# Patient Record
Sex: Female | Born: 1935 | Race: White | Hispanic: No | Smoking: Never smoker
Health system: Southern US, Community
[De-identification: ages and names within clinical notes are randomized; demographics above are authoritative.]

## PROBLEM LIST (undated history)

## (undated) DIAGNOSIS — E079 Disorder of thyroid, unspecified: Secondary | ICD-10-CM

## (undated) DIAGNOSIS — E871 Hypo-osmolality and hyponatremia: Secondary | ICD-10-CM

## (undated) DIAGNOSIS — E785 Hyperlipidemia, unspecified: Secondary | ICD-10-CM

## (undated) DIAGNOSIS — I499 Cardiac arrhythmia, unspecified: Secondary | ICD-10-CM

## (undated) DIAGNOSIS — I10 Essential (primary) hypertension: Secondary | ICD-10-CM

## (undated) HISTORY — PX: CARDIAC SURGERY: SHX584

## (undated) HISTORY — PX: APPENDECTOMY: SHX54

---

## 2003-12-13 ENCOUNTER — Other Ambulatory Visit: Payer: Self-pay

## 2004-12-17 ENCOUNTER — Ambulatory Visit: Payer: Self-pay | Admitting: Family Medicine

## 2005-01-03 ENCOUNTER — Ambulatory Visit: Payer: Self-pay | Admitting: Rheumatology

## 2005-12-23 ENCOUNTER — Ambulatory Visit: Payer: Self-pay | Admitting: Family Medicine

## 2007-01-01 ENCOUNTER — Ambulatory Visit: Payer: Self-pay | Admitting: Family Medicine

## 2008-01-04 ENCOUNTER — Ambulatory Visit: Payer: Self-pay | Admitting: Family Medicine

## 2008-03-02 ENCOUNTER — Ambulatory Visit: Payer: Self-pay | Admitting: Unknown Physician Specialty

## 2008-03-08 ENCOUNTER — Ambulatory Visit: Payer: Self-pay | Admitting: Unknown Physician Specialty

## 2009-01-05 ENCOUNTER — Ambulatory Visit: Payer: Self-pay | Admitting: Family Medicine

## 2009-11-09 ENCOUNTER — Ambulatory Visit: Payer: Self-pay | Admitting: Family Medicine

## 2009-11-28 ENCOUNTER — Ambulatory Visit: Payer: Self-pay | Admitting: Family Medicine

## 2010-01-09 ENCOUNTER — Ambulatory Visit: Payer: Self-pay | Admitting: Family Medicine

## 2010-09-17 ENCOUNTER — Emergency Department: Payer: Self-pay | Admitting: Emergency Medicine

## 2011-08-01 ENCOUNTER — Ambulatory Visit: Payer: Self-pay | Admitting: Urology

## 2011-08-08 ENCOUNTER — Ambulatory Visit: Payer: Self-pay | Admitting: Urology

## 2011-08-08 LAB — CREATININE, SERUM
Creatinine: 0.81 mg/dL (ref 0.60–1.30)
EGFR (Non-African Amer.): >60

## 2011-10-14 ENCOUNTER — Emergency Department: Payer: Self-pay | Admitting: *Deleted

## 2011-10-14 LAB — BASIC METABOLIC PANEL
Anion Gap: 9 (ref 7–16)
BUN: 16 mg/dL (ref 7–18)
Calcium, Total: 8.5 mg/dL (ref 8.5–10.1)
Creatinine: 0.77 mg/dL (ref 0.60–1.30)
EGFR (African American): >60
EGFR (Non-African Amer.): >60
Potassium: 3.8 mmol/L (ref 3.5–5.1)

## 2011-10-14 LAB — CBC
HCT: 34 % — ABNORMAL LOW (ref 35.0–47.0)
MCH: 28.8 pg (ref 26.0–34.0)
MCV: 87 fL (ref 80–100)
Platelet: 316 10*3/uL (ref 150–440)
RBC: 3.93 10*6/uL (ref 3.80–5.20)
WBC: 5 10*3/uL (ref 3.6–11.0)

## 2011-10-14 LAB — URINALYSIS, COMPLETE
Bacteria: NONE SEEN
Blood: NEGATIVE
Glucose,UR: NEGATIVE mg/dL (ref 0–75)
Nitrite: NEGATIVE
Ph: 6 (ref 4.5–8.0)
Protein: NEGATIVE
Squamous Epithelial: NONE SEEN
WBC UR: <1 /HPF (ref 0–5)

## 2012-07-15 ENCOUNTER — Ambulatory Visit: Payer: Self-pay | Admitting: Neurology

## 2012-09-30 ENCOUNTER — Ambulatory Visit: Payer: Self-pay | Admitting: Family Medicine

## 2013-08-11 ENCOUNTER — Ambulatory Visit: Payer: Self-pay | Admitting: Ophthalmology

## 2014-02-03 ENCOUNTER — Ambulatory Visit: Payer: Self-pay | Admitting: Family Medicine

## 2014-02-03 ENCOUNTER — Observation Stay: Payer: Self-pay | Admitting: Surgery

## 2014-02-03 LAB — COMPREHENSIVE METABOLIC PANEL
Albumin: 2.9 g/dL — ABNORMAL LOW (ref 3.4–5.0)
Alkaline Phosphatase: 72 U/L
Anion Gap: 12 (ref 7–16)
BILIRUBIN TOTAL: 0.6 mg/dL (ref 0.2–1.0)
BUN: 14 mg/dL (ref 7–18)
CALCIUM: 8.3 mg/dL — AB (ref 8.5–10.1)
CHLORIDE: 94 mmol/L — AB (ref 98–107)
CO2: 22 mmol/L (ref 21–32)
Creatinine: 0.73 mg/dL (ref 0.60–1.30)
EGFR (African American): >60
EGFR (Non-African Amer.): >60
GLUCOSE: 104 mg/dL — AB (ref 65–99)
OSMOLALITY: 258 (ref 275–301)
Potassium: 3.6 mmol/L (ref 3.5–5.1)
SGOT(AST): 13 U/L — ABNORMAL LOW (ref 15–37)
SGPT (ALT): 13 U/L — ABNORMAL LOW
Sodium: 128 mmol/L — ABNORMAL LOW (ref 136–145)
Total Protein: 6.7 g/dL (ref 6.4–8.2)

## 2014-02-03 LAB — URINALYSIS, COMPLETE
BILIRUBIN, UR: NEGATIVE
Glucose,UR: NEGATIVE mg/dL (ref 0–75)
Leukocyte Esterase: NEGATIVE
NITRITE: NEGATIVE
PH: 6 (ref 4.5–8.0)
PROTEIN: NEGATIVE
SQUAMOUS EPITHELIAL: NONE SEEN
Specific Gravity: 1.038 (ref 1.003–1.030)
WBC UR: 3 /HPF (ref 0–5)

## 2014-02-03 LAB — CBC
HCT: 36.1 % (ref 35.0–47.0)
HGB: 12.2 g/dL (ref 12.0–16.0)
MCH: 30.4 pg (ref 26.0–34.0)
MCHC: 33.9 g/dL (ref 32.0–36.0)
MCV: 90 fL (ref 80–100)
PLATELETS: 236 10*3/uL (ref 150–440)
RBC: 4.02 10*6/uL (ref 3.80–5.20)
RDW: 13.2 % (ref 11.5–14.5)
WBC: 14.8 10*3/uL — ABNORMAL HIGH (ref 3.6–11.0)

## 2014-02-03 LAB — PROTIME-INR
INR: 1.1
Prothrombin Time: 13.8 secs (ref 11.5–14.7)

## 2014-02-03 LAB — LIPASE, BLOOD: Lipase: 98 U/L (ref 73–393)

## 2014-02-05 LAB — BASIC METABOLIC PANEL
Anion Gap: 6 — ABNORMAL LOW (ref 7–16)
BUN: 9 mg/dL (ref 7–18)
CALCIUM: 7.6 mg/dL — AB (ref 8.5–10.1)
CHLORIDE: 102 mmol/L (ref 98–107)
Co2: 24 mmol/L (ref 21–32)
Creatinine: 0.73 mg/dL (ref 0.60–1.30)
EGFR (African American): >60
EGFR (Non-African Amer.): >60
GLUCOSE: 73 mg/dL (ref 65–99)
Osmolality: 262 (ref 275–301)
Potassium: 2.9 mmol/L — ABNORMAL LOW (ref 3.5–5.1)
SODIUM: 132 mmol/L — AB (ref 136–145)

## 2014-02-07 LAB — PATHOLOGY REPORT

## 2014-02-10 ENCOUNTER — Ambulatory Visit: Payer: Self-pay | Admitting: Family Medicine

## 2014-02-12 ENCOUNTER — Emergency Department: Payer: Self-pay | Admitting: Emergency Medicine

## 2014-02-12 LAB — BASIC METABOLIC PANEL
ANION GAP: 7 (ref 7–16)
BUN: 11 mg/dL (ref 7–18)
CALCIUM: 8.5 mg/dL (ref 8.5–10.1)
CREATININE: 0.72 mg/dL (ref 0.60–1.30)
Chloride: 90 mmol/L — ABNORMAL LOW (ref 98–107)
Co2: 29 mmol/L (ref 21–32)
EGFR (African American): >60
GLUCOSE: 117 mg/dL — AB (ref 65–99)
OSMOLALITY: 254 (ref 275–301)
Potassium: 3.9 mmol/L (ref 3.5–5.1)
SODIUM: 126 mmol/L — AB (ref 136–145)

## 2014-02-12 LAB — CBC
HCT: 40.5 % (ref 35.0–47.0)
HGB: 13.1 g/dL (ref 12.0–16.0)
MCH: 29.3 pg (ref 26.0–34.0)
MCHC: 32.3 g/dL (ref 32.0–36.0)
MCV: 91 fL (ref 80–100)
PLATELETS: 526 10*3/uL — AB (ref 150–440)
RBC: 4.46 10*6/uL (ref 3.80–5.20)
RDW: 13.3 % (ref 11.5–14.5)
WBC: 9.8 10*3/uL (ref 3.6–11.0)

## 2014-02-12 LAB — TROPONIN I: Troponin-I: <0.02 ng/mL

## 2014-03-02 IMAGING — CR DG CHEST 2V
1 series · 2 of 2 positions shown · non-contrast
Comparison: none

REASON FOR EXAM: syncopal
COMMENTS:

[Series 1: pa · 0.17mm/px · 2 of 2 slices shown]
[im 1/2]
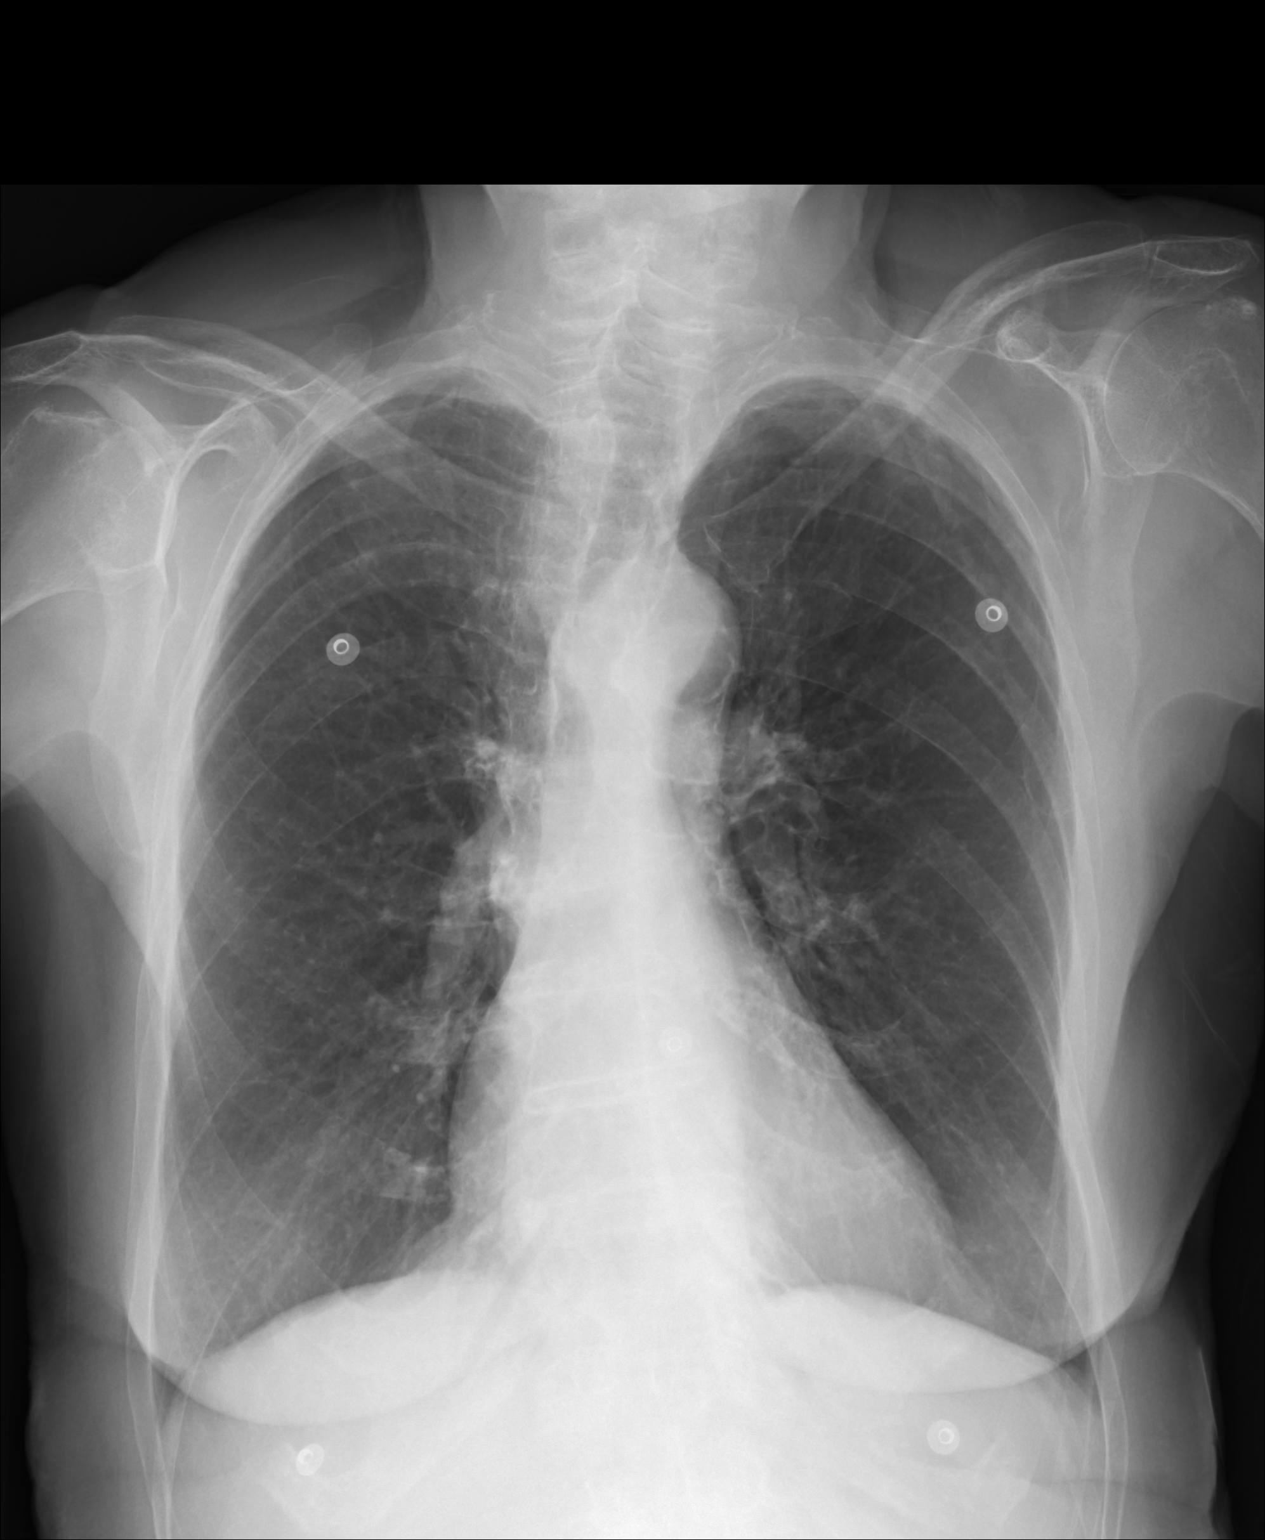
[im 2/2]
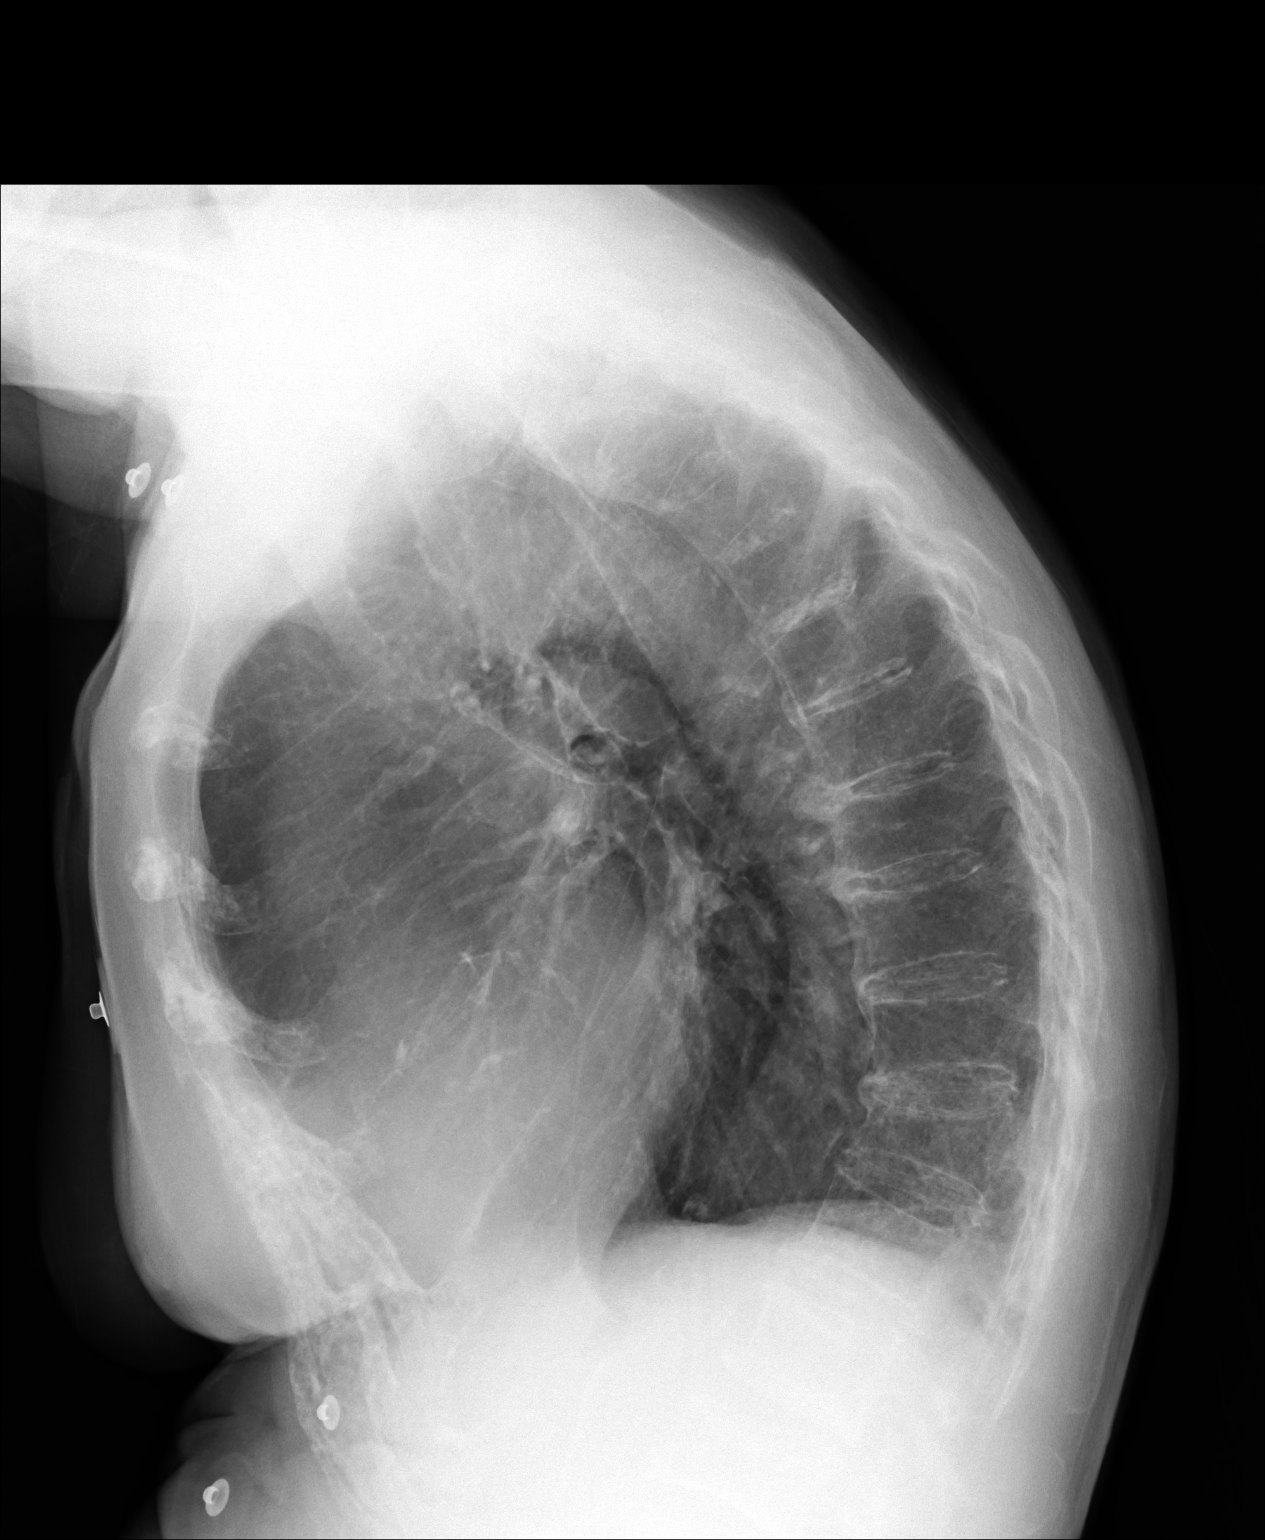

[2 of 2 positions shown; findings below may reference images not displayed]

PROCEDURE:     DXR - DXR CHEST PA (OR AP) AND LATERAL  - October 14, 2011  [DATE]

RESULT:     Comparison is made to the prior exam of 09/17/2010.

The lung fields are clear. No pneumonia, pneumothorax or pleural effusion is
seen. There is a mild, old central vertebral compression deformity of T12 of
approximately 20% to 30%. The heart size is normal.
IMPRESSION: 1.  No acute changes are identified.
2.  The lung fields are clear.
3.  There is a mild, old appearing biconcave vertebral compression deformity
of T12.

[REDACTED]

## 2014-05-20 HISTORY — PX: PACEMAKER INSERTION: SHX728

## 2014-09-10 NOTE — Op Note (Signed)
PATIENT NAME:  Laura Delacruz, Laura Delacruz MR#:  [id number] DATE OF BIRTH:  [birth year]  DATE OF PROCEDURE:  02/04/2014  PREOPERATIVE DIAGNOSIS:  Acute appendicitis.   POSTOPERATIVE DIAGNOSIS: Acute appendicitis, gangrenous and perforated.   SURGEON:  Consuela Mimes, MD   ANESTHESIA:  General.   OPERATION PERFORMED:  Laparoscopic appendectomy.   PROCEDURE IN DETAIL:  The patient was placed supine on the operating room table and prepped and draped in the usual sterile fashion. A 15 mmHg CO2 pneumoperitoneum was created via a Hassan cannula that was introduced in the supraumbilical midline amidst horizontal mattress sutures of 0 Vicryl. The other two 5 mm trocars were placed under direct visualization. The patient's omentum was densely adherent over the appendix, and after this was peeled off, it was apparent that the appendix was gangrenous near the base with foul smelling pus that emanated. The mesoappendix was taken down with harmonic scalpel and the appendectomy was performed with an Endo GIA stapling device just where the appendix met the cecum such that the staple line was on viable tissue. There was a small area of cecal wall that was discolored from the gangrenous appendix, but this was thought to be sympathetic and not actual cecal wall necrosis. The right lower quadrant was irrigated with copious amounts of warm normal saline. This was suctioned clear from the right lower quadrant, the right gutter, the Morison pouch and the pelvis. The omentum was dragged back over top of the appendiceal stump area and the peritoneum was desufflated and decannulated. The previously placed Vicryls were tied one to another and an additional 0 PDS suture was placed amidst these and then all three skin sites were closed with subcuticular 5-0 Monocryl and suture strips. The patient tolerated the procedure well. There were no complications.    ____________________________ Consuela Mimes, MD wfm:aw D: 02/04/2014  02:26:00 ET T: 02/04/2014 04:02:47 ET JOB#: [id number] Consuela Mimes MD ELECTRONICALLY SIGNED 02/11/2014 20:19

## 2014-09-10 NOTE — H&P (Signed)
History of Present Illness 81 yof wh began having mild abdominal pain 2 1/2 days ago (Tuesday AM). SHe has been able to sleep both nights, but today her pain worsened, particularly with movement. No fever, chills, anorexia (last ate at 1130), vomiting. She has had some nausea.   Past History HTN Hypothyroidism Dyslipidemia   Past Med/Surgical Hx:  Multi-drug Resistant Organism (MDRO): Positive culture for MRSA.  left arm fx:   Left hip fx:   ALLERGIES:  No Known Allergies:   HOME MEDICATIONS: Medication Instructions Status  Aspirin Enteric Coated 81 mg oral delayed release tablet 1 tab(s) orally once a day Active  calcium carbonate 500 mg oral tablet, chewable 1 tab(s) orally 2 times a day Active  Vitamin D3 1000 intl units oral tablet 1 tab(s) orally once a day Active  Vitamin B-12 1000 mcg oral tablet 1 tab(s) orally once a day Active  Klor-Con 20 mEq oral tablet, extended release 1 tab(s) orally 2 times a day Active  levothyroxine 112 mcg (0.112 mg) oral tablet 1 tab(s) orally once a day Active  lisinopril 10 mg oral tablet 1 tab(s) orally once a day Active  omega-3 polyunsaturated fatty acids 1200 mg oral capsule 1 cap(s) orally 2 times a day Active   Family and Social History:  Family History Non-Contributory   Social History negative tobacco, negative ETOH, widowed, lives alone, worked for [employer], retired, family present   Review of Systems:  Fever/Chills No   Cough No   Sputum No   Abdominal Pain Yes   Diarrhea No   Constipation No   Nausea/Vomiting Yes   SOB/DOE No   Chest Pain No   Dysuria No   Tolerating PT Yes   Tolerating Diet Yes  Nauseated   Medications/Allergies Reviewed Medications/Allergies reviewed   Physical Exam:  GEN well developed, well nourished, no acute distress, thin   HEENT pink conjunctivae, PERRL, hearing intact to voice, moist oral mucosa, Oropharynx clear   NECK supple  trachea midline   RESP normal resp effort  clear BS   no use of accessory muscles   CARD regular rate  no murmur  no JVD  no Rub   ABD positive tenderness  rebound and guarding, worse in RLQ than LLQ, mildly distended   LYMPH negative neck   EXTR negative cyanosis/clubbing, negative edema   SKIN normal to palpation, skin turgor good   NEURO cranial nerves intact, negative tremor, follows commands, motor/sensory function intact   PSYCH alert, A+O to time, place, person   Lab Results: Hepatic:  17-Sep-15 19:32   Bilirubin, Total 0.6  Alkaline Phosphatase 72 (46-116 NOTE: New Reference Range 12/07/13)  SGPT (ALT)  13 (14-63 NOTE: New Reference Range 12/07/13)  SGOT (AST)  13  Total Protein, Serum 6.7  Albumin, Serum  2.9  Routine Chem:  17-Sep-15 19:32   Glucose, Serum  104  BUN 14  Creatinine (comp) 0.73  Sodium, Serum  128  Potassium, Serum 3.6  Chloride, Serum  94  CO2, Serum 22  Calcium (Total), Serum  8.3  Osmolality (calc) 258  eGFR (African American) >60  eGFR (Non-African American) >60 (eGFR values <43m/min/1.73 m2 may be an indication of chronic kidney disease (CKD). Calculated eGFR is useful in patients with stable renal function. The eGFR calculation will not be reliable in acutely ill patients when serum creatinine is changing rapidly. It is not useful in  patients on dialysis. The eGFR calculation may not be applicable to patients at the low and  high extremes of body sizes, pregnant women, and vegetarians.)  Anion Gap 12  Lipase 98 (Result(s) reported on 03 Feb 2014 at 08:00PM.)  Routine UA:  17-Sep-15 19:32   Color (UA) Yellow  Clarity (UA) Clear  Glucose (UA) Negative  Bilirubin (UA) Negative  Ketones (UA) Trace  Specific Gravity (UA) 1.038  Blood (UA) 2+  pH (UA) 6.0  Protein (UA) Negative  Nitrite (UA) Negative  Leukocyte Esterase (UA) Negative (Result(s) reported on 03 Feb 2014 at 08:40PM.)  RBC (UA) 3 /HPF  WBC (UA) 3 /HPF  Bacteria (UA) TRACE  Epithelial Cells (UA) NONE SEEN   Result(s) reported on 03 Feb 2014 at 08:40PM.  Routine Coag:  17-Sep-15 19:32   Prothrombin 13.8  INR 1.1 (INR reference interval applies to patients on anticoagulant therapy. A single INR therapeutic range for coumarins is not optimal for all indications; however, the suggested range for most indications is 2.0 - 3.0. Exceptions to the INR Reference Range may include: Prosthetic heart valves, acute myocardial infarction, prevention of myocardial infarction, and combinations of aspirin and anticoagulant. The need for a higher or lower target INR must be assessed individually. Reference: The Pharmacology and Management of the Vitamin K  antagonists: the seventh ACCP Conference on Antithrombotic and Thrombolytic Therapy. ZSWFU.9323 Sept:126 (3suppl): [id number]. A HCT value >55% may artifactually increase the PT.  In one study,  the increase was an average of 25%. Reference:  "Effect on Routine and Special Coagulation Testing Values of Citrate Anticoagulant Adjustment in Patients with High HCT Values." American Journal of Clinical Pathology 2006;126:400-405.)  Routine Hem:  17-Sep-15 19:32   WBC (CBC)  14.8  RBC (CBC) 4.02  Hemoglobin (CBC) 12.2  Hematocrit (CBC) 36.1  Platelet Count (CBC) 236 (Result(s) reported on 03 Feb 2014 at 07:44PM.)  MCV 90  MCH 30.4  MCHC 33.9  RDW 13.2   Radiology Results: XRay:    17-Sep-15 20:05, Chest Portable Single View  Chest Portable Single View  REASON FOR EXAM:    preop  COMMENTS:       PROCEDURE: DXR - DXR PORTABLE CHEST SINGLE VIEW  - Feb 03 2014  8:05PM     CLINICAL DATA:  c/o weakness and epigastric pain.    EXAM:  PORTABLE CHEST - 1 VIEW    COMPARISON:  Chest radiograph 10/14/2011    FINDINGS:  Stable cardiac and mediastinal contours. Tortuosity of the thoracic  aorta. Bilateral coarse interstitial pulmonary opacities, unchanged.  No evidence for acute superimposed consolidative opacity. Regional  skeleton is unremarkable.      IMPRESSION:  No acute cardiopulmonary process.      Electronically Signed    By: Lovey Newcomer M.D.    On: 02/03/2014 20:52         Verified By: Ilsa Iha, M.D.,  LabUnknown:    17-Sep-15 17:54, CT Abdomen and Pelvis With Contrast  PACS Image    17-Sep-15 20:05, Chest Portable Single View  PACS Image  CT:    17-Sep-15 17:54, CT Abdomen and Pelvis With Contrast  CT Abdomen and Pelvis With Contrast  REASON FOR EXAM:    CALL REPORT [phone] LABS FIRST Chronic Abd Pain   RLQ  Eval Appendicitis ...  COMMENTS:       PROCEDURE: CT  - CT ABDOMEN / PELVIS  W  - Feb 03 2014  5:54PM     CLINICAL DATA:  81 year old female with right-sided abdominal and  pelvic pain and diarrhea.    EXAM:  CT  ABDOMEN AND PELVIS WITH CONTRAST    TECHNIQUE:  Multidetector CT imaging of the abdomen and pelvis was performed  using the standard protocol following bolus administration of  intravenous contrast.    CONTRAST:  85 cc intravenous Isovue-300    COMPARISON:  08/08/2011 IVP and 11/2010 2011 lumbar spine MRI    FINDINGS:  A thickened enlarged appendix with adjacent inflammation is  identified compatible with appendicitis. The appendix extends  inferiorly and anteriorly with tip in the low anterior right pelvis.  There is no evidence of free fluid, pneumoperitoneum or abscess.    The liver is unremarkable except for a statistically benign 4 mm  hypodense lesion within the right liver (image 20).  Spleen, kidneys, adrenal glands, pancreas and gallbladder are  unremarkable.    There is no evidence of bowel obstruction, biliary dilatation,  enlarged lymph nodes or abdominal aortic aneurysm.    Compression fractures of T12, L3 and L4 are again identified. No  acute bony abnormalities are noted.     IMPRESSION:  Appendicitis without free fluid, pneumoperitoneum or abscess.    This was discussed with Dr. Eliberto Ivory at 6:20 p.m. on 02/03/2014. The  patient was sent to the emergency  room for further evaluation.  Electronically Signed    By: Hassan Rowan M.D.    On: 02/03/2014 18:27         Verified By: Lura Em, M.D.,    Assessment/Admission Diagnosis Acute appendicitis   Plan IVF, IV ABx, lap appy Pt and family understand plan; they agree and are satisfied   Electronic Signatures: Consuela Mimes (MD)  (Signed 17-Sep-15 21:12)  Authored: CHIEF COMPLAINT and HISTORY, PAST MEDICAL/SURGIAL HISTORY, ALLERGIES, HOME MEDICATIONS, FAMILY AND SOCIAL HISTORY, REVIEW OF SYSTEMS, PHYSICAL EXAM, LABS, Radiology, ASSESSMENT AND PLAN   Last Updated: 17-Sep-15 21:12 by Consuela Mimes (MD)

## 2014-09-10 NOTE — Discharge Summary (Signed)
PATIENT NAME:  Laura Delacruz, Laura Delacruz MR#:  [id number] DATE OF BIRTH:  [birth year]  DATE OF ADMISSION:  02/03/2014 DATE OF DISCHARGE:  02/06/2014  PRINCIPLE DIAGNOSIS: Gangrenous perforated acute appendicitis.   OTHER DIAGNOSES: Hypertension, hypothyroidism, dyslipidemia, history of methicillin-resistant Staphylococcus aureus.  PRINCIPAL PROCEDURE PERFORMED DURING THIS ADMISSION: Laparoscopic appendectomy that was done on 02/04/2014.   HOSPITAL COURSE: The patient was admitted to the hospital and given IV fluids and IV antibiotics and taken to the operating room, where she underwent the above-mentioned procedure for the above-mentioned diagnosis. On postoperative day 1 she had some nausea and anxiety but was doing well and her diet was advanced, and on postoperative day 2 she was tolerating a regular diet and her pain was controlled with oral medications, and therefore she was discharged home on her usual prehospital medication regimen, and in addition to that she was given Norco for pain and Augmentin as an oral antibiotic. She was asked to make an appointment to see me in a week or 10 days, and to call the office in the interim for any problems.    ____________________________ Claude MangesWilliam F. Dolph Tavano, MD wfm:ST D: 02/14/2014 00:04:30 ET T: 02/14/2014 01:26:04 ET JOB#: [id number]  cc: Claude MangesWilliam F. Greer Koeppen, MD, <Dictator> Claude MangesWILLIAM F Kirstie Larsen MD ELECTRONICALLY SIGNED 02/21/2014 16:28

## 2015-06-11 ENCOUNTER — Emergency Department: Payer: Medicare Other

## 2015-06-11 ENCOUNTER — Ambulatory Visit (INDEPENDENT_AMBULATORY_CARE_PROVIDER_SITE_OTHER)
Admission: EM | Admit: 2015-06-11 | Discharge: 2015-06-11 | Disposition: A | Discharge status: Hospice | Payer: Medicare Other | Source: Home / Self Care | Attending: Family Medicine | Admitting: Family Medicine

## 2015-06-11 ENCOUNTER — Emergency Department
Admission: EM | Admit: 2015-06-11 | Discharge: 2015-06-11 | Disposition: A | Discharge status: Home / Self Care | Payer: Medicare Other | Attending: Emergency Medicine | Admitting: Emergency Medicine

## 2015-06-11 DIAGNOSIS — Z7982 Long term (current) use of aspirin: Secondary | ICD-10-CM | POA: Insufficient documentation

## 2015-06-11 DIAGNOSIS — I10 Essential (primary) hypertension: Secondary | ICD-10-CM

## 2015-06-11 DIAGNOSIS — R079 Chest pain, unspecified: Secondary | ICD-10-CM | POA: Insufficient documentation

## 2015-06-11 DIAGNOSIS — R0789 Other chest pain: Secondary | ICD-10-CM

## 2015-06-11 DIAGNOSIS — Z79899 Other long term (current) drug therapy: Secondary | ICD-10-CM | POA: Diagnosis not present

## 2015-06-11 DIAGNOSIS — E079 Disorder of thyroid, unspecified: Secondary | ICD-10-CM | POA: Insufficient documentation

## 2015-06-11 DIAGNOSIS — Z952 Presence of prosthetic heart valve: Secondary | ICD-10-CM | POA: Diagnosis not present

## 2015-06-11 DIAGNOSIS — E785 Hyperlipidemia, unspecified: Secondary | ICD-10-CM

## 2015-06-11 HISTORY — DX: Disorder of thyroid, unspecified: E07.9

## 2015-06-11 HISTORY — DX: Essential (primary) hypertension: I10

## 2015-06-11 HISTORY — DX: Cardiac arrhythmia, unspecified: I49.9

## 2015-06-11 HISTORY — DX: Hyperlipidemia, unspecified: E78.5

## 2015-06-11 LAB — CBC
HEMATOCRIT: 37.4 % (ref 35.0–47.0)
Hemoglobin: 12.7 g/dL (ref 12.0–16.0)
MCH: 29.6 pg (ref 26.0–34.0)
MCHC: 34 g/dL (ref 32.0–36.0)
MCV: 87 fL (ref 80.0–100.0)
Platelets: 275 10*3/uL (ref 150–440)
RBC: 4.3 MIL/uL (ref 3.80–5.20)
RDW: 13.5 % (ref 11.5–14.5)
WBC: 8.4 10*3/uL (ref 3.6–11.0)

## 2015-06-11 LAB — COMPREHENSIVE METABOLIC PANEL
ALT: 15 U/L (ref 14–54)
AST: 18 U/L (ref 15–41)
Albumin: 3.8 g/dL (ref 3.5–5.0)
Alkaline Phosphatase: 65 U/L (ref 38–126)
Anion gap: 7 (ref 5–15)
BILIRUBIN TOTAL: 0.5 mg/dL (ref 0.3–1.2)
BUN: 24 mg/dL — AB (ref 6–20)
CO2: 29 mmol/L (ref 22–32)
CREATININE: 0.69 mg/dL (ref 0.44–1.00)
Calcium: 9.2 mg/dL (ref 8.9–10.3)
Chloride: 99 mmol/L — ABNORMAL LOW (ref 101–111)
GFR calc Af Amer: >60 mL/min (ref >60)
Glucose, Bld: 109 mg/dL — ABNORMAL HIGH (ref 65–99)
POTASSIUM: 4.1 mmol/L (ref 3.5–5.1)
Sodium: 135 mmol/L (ref 135–145)
TOTAL PROTEIN: 7.3 g/dL (ref 6.5–8.1)

## 2015-06-11 LAB — TROPONIN I
TROPONIN I: 0.03 ng/mL (ref <0.031)
Troponin I: 0.04 ng/mL — ABNORMAL HIGH (ref <0.031)

## 2015-06-11 MED ORDER — ASPIRIN 81 MG PO CHEW
324.0000 mg | CHEWABLE_TABLET | Freq: Once | ORAL | Status: AC
  Administered 2015-06-11: 324 mg via ORAL

## 2015-06-11 NOTE — ED Notes (Signed)
Dr. Pershing Proud notified of repeat troponin of 0.04. No new orders received.

## 2015-06-11 NOTE — ED Provider Notes (Signed)
[facility] Emergency Department Provider Note  ____________________________________________  Time seen: Approximately 305 PM  I have reviewed the triage vital signs and the nursing notes.   HISTORY  Chief Complaint Chest Pain    HPI Laura Delacruz is a 82 y.o. female with a history of arrhythmia and hypertension status post pacemaker placement who is presenting today with intermittent chest pain over the past 2-3 days. She says that the pain is sharp and lasts less than a second and is on the left side of her lower chest. She denies it being associated with nausea, vomiting, diaphoresis, shortness of breath. She says that she does not know of any factors that bring it on. She says that she has had several episodes earlier today. Denies any recent over exertion or lifting. Denies any recent cough. Denies any history of stenting or cardiac bypass surgery. He is pain-free at this time. Not exacerbated with exertion.   Past Medical History  Diagnosis Date  . Irregular heart rate   . Hypertension   . Hyperlipidemia   . Thyroid disease     There are no active problems to display for this patient.   Past Surgical History  Procedure Laterality Date  . Pacemaker insertion Left 2016  . Appendectomy    . Cardiac surgery      Current Outpatient Rx  Name  Route  Sig  Dispense  Refill  . amLODipine (NORVASC) 5 MG tablet   Oral   Take 5 mg by mouth daily.         Marland Kitchen aspirin 81 MG tablet   Oral   Take 81 mg by mouth daily.         . Cholecalciferol (VITAMIN D-3) 1000 units CAPS   Oral   Take by mouth.         . levothyroxine (SYNTHROID, LEVOTHROID) 112 MCG tablet   Oral   Take 112 mcg by mouth daily before breakfast.         . lisinopril (PRINIVIL,ZESTRIL) 20 MG tablet   Oral   Take 20 mg by mouth daily.         . potassium chloride (KLOR-CON) 20 MEQ packet   Oral   Take by mouth 2 (two) times daily.            Allergies Review of patient's allergies indicates no known allergies.  No family history on file.  Social History Social History  Substance Use Topics  . Smoking status: Never Smoker   . Smokeless tobacco: None  . Alcohol Use: No    Review of Systems Constitutional: No fever/chills Eyes: No visual changes. ENT: No sore throat. Cardiovascular: As above Respiratory: Denies shortness of breath. Gastrointestinal: No abdominal pain.  No nausea, no vomiting.  No diarrhea.  No constipation. Genitourinary: Negative for dysuria. Musculoskeletal: Negative for back pain. Skin: Negative for rash. Neurological: Negative for headaches, focal weakness or numbness.  10-point ROS otherwise negative.  ____________________________________________   PHYSICAL EXAM:  VITAL SIGNS: ED Triage Vitals  Enc Vitals Group     BP --      Pulse --      Resp --      Temp 06/11/15 1443 98.6 F (37 C)     Temp src --      SpO2 --      Weight 06/11/15 1443 124 lb (56.246 kg)     Height 06/11/15 1443  (1.6 m)     Head Cir --  Peak Flow --      Pain Score --      Pain Loc --      Pain Edu? --      Excl. in GC? --     Constitutional: Alert and oriented. Well appearing and in no acute distress. Eyes: Conjunctivae are normal. PERRL. EOMI. Head: Atraumatic. Nose: No congestion/rhinnorhea. Mouth/Throat: Mucous membranes are moist.   Neck: No stridor.   Cardiovascular: Normal rate, regular rhythm. Grossly normal heart sounds.  Good peripheral circulation. Pain reproducible palpation. Pacemaker pocket without any surrounding erythema, or tenderness. Respiratory: Normal respiratory effort.  No retractions. Lungs CTAB. Gastrointestinal: Soft and nontender. No distention. No abdominal bruits. No CVA tenderness. Musculoskeletal: No lower extremity tenderness nor edema.  No joint effusions. Neurologic:  Normal speech and language. No gross focal neurologic deficits are appreciated. No gait  instability. Skin:  Skin is warm, dry and intact. No rash noted. Psychiatric: Mood and affect are normal. Speech and behavior are normal.  ____________________________________________   LABS (all labs ordered are listed, but only abnormal results are displayed)  Labs Reviewed  COMPREHENSIVE METABOLIC PANEL - Abnormal; Notable for the following:    Chloride 99 (*)    Glucose, Bld 109 (*)    BUN 24 (*)    All other components within normal limits  TROPONIN I - Abnormal; Notable for the following:    Troponin I 0.04 (*)    All other components within normal limits  CBC  TROPONIN I   ____________________________________________  EKG  ED ECG REPORT I, Arelia Longest, the attending physician, personally viewed and interpreted this ECG.   Date: 06/11/2015  EKG Time: 1447  Rate: 93  Rhythm: normal sinus rhythm or ectopic atrial rhythm  Axis: Normal axis  Intervals:left bundle branch block  ST&T Change: Inferior ST elevation without any reversible depression in leads 2, 3 and aVF. T-wave inversions in aVL. PVC 2.  This EKG confounded by non-paced mode of being on on the cardiac monitor. We'll repeat with paced mode on.  ED ECG REPORT I, Arelia Longest, the attending physician, personally viewed and interpreted this ECG.   Date: 06/11/2015  EKG Time: 1512  Rate: 90  Rhythm: Atrial sensed ventricular pacing.  Axis:   Intervals:left bundle branch block  ST&T Change: T wave inversions in aVL. ST elevation consistent with left bundle branch block pattern from ventricular pacing.   ____________________________________________  RADIOLOGY  No active disease on the chest x-ray. ____________________________________________   PROCEDURES    ____________________________________________   INITIAL IMPRESSION / ASSESSMENT AND PLAN / ED COURSE  Pertinent labs & imaging results that were available during my care of the patient were reviewed by me and considered in  my medical decision making (see chart for details).  ----------------------------------------- 8:05 PM on 06/11/2015 -----------------------------------------  Patient resting comfortably at this time without any chest pain. Second troponin back and is 0.04. Discussed this with Dr. Juliann Pares who is on-call for the patient's cardiologist, Dr. Gwen Pounds. I discussed the case with him and that the chest pain was very atypical and he says that he agrees that the patient should be appropriate for follow-up in the office and should call first thing in the morning. I discussed this plan with the patient as well as her son who is at the bedside. They're understanding of the plan and willing to comply. She will try Aspercreme to the area for relief of any further pain. However, she does understand that if she has  any worsening or concerning symptoms, especially if the pain worsens at any time she is to return to the emergency department for further workup and treatment. There is definitely a chest wall component to this as there was reproducible chest tenderness. ____________________________________________   FINAL CLINICAL IMPRESSION(S) / ED DIAGNOSES  Chest pain.    Myrna Blazer, MD 06/11/15 2007

## 2015-06-11 NOTE — ED Notes (Signed)
Pt denies needs at this time, family denies needs, explanation of treatment process explained to pt who verbalizes understanding. Family at bedside, will notify md of repeat troponin value.

## 2015-06-11 NOTE — ED Provider Notes (Addendum)
CSN: [id number]     Arrival date & time 06/11/15  1321 History   First MD Initiated Contact with Patient 06/11/15 1345     Chief Complaint  Patient presents with  . Chest Pain   (Consider location/radiation/quality/duration/timing/severity/associated sxs/prior Treatment) HPI: Patient presents today with symptoms of intermittent chest pain for the last 2 days. Patient states that at times she feels sharp pain on the left side of her chest. She denies any radiation of pain or any shortness of breath. She denies any nausea, vomiting, diarrhea, abdominal pain, dizziness, syncope, lightheadedness. Patient does have a history of a pacemaker being placed last year. She denies any history of MI in the past. She does have a history of hypertension. She is only taken a baby aspirin earlier this morning along with her blood pressure medication.  Past Medical History  Diagnosis Date  . Irregular heart rate   . Hypertension   . Hyperlipidemia   . Thyroid disease    Past Surgical History  Procedure Laterality Date  . Pacemaker insertion Left 2016  . Appendectomy    . Cardiac surgery     History reviewed. No pertinent family history. Social History  Substance Use Topics  . Smoking status: Never Smoker   . Smokeless tobacco: None  . Alcohol Use: No   OB History    No data available     Review of Systems: Negative except mentioned above.  Allergies  Review of patient's allergies indicates no known allergies.  Home Medications   Prior to Admission medications   Medication Sig Start Date End Date Taking? Authorizing Provider  amLODipine (NORVASC) 5 MG tablet Take 5 mg by mouth daily.   Yes Historical Provider, MD  aspirin 81 MG tablet Take 81 mg by mouth daily.   Yes Historical Provider, MD  Cholecalciferol (VITAMIN D-3) 1000 units CAPS Take by mouth.   Yes Historical Provider, MD  levothyroxine (SYNTHROID, LEVOTHROID) 112 MCG tablet Take 112 mcg by mouth daily before breakfast.   Yes  Historical Provider, MD  lisinopril (PRINIVIL,ZESTRIL) 20 MG tablet Take 20 mg by mouth daily.   Yes Historical Provider, MD  potassium chloride (KLOR-CON) 20 MEQ packet Take by mouth 2 (two) times daily.   Yes Historical Provider, MD   Meds Ordered and Administered this Visit   Medications  aspirin chewable tablet 324 mg (324 mg Oral Given 06/11/15 1343)    BP 170/79 mmHg  Pulse 80  Temp(Src) 98.7 F (37.1 C) (Tympanic)  Resp 18  Ht  (1.6 m)  Wt 124 lb (56.246 kg)  BMI 21.97 kg/m2  SpO2 98% No data found.   Physical Exam   GENERAL: NAD HEENT: no pharyngeal erythema, no exudate RESP: CTA B, no tenderness to palpation CARD: RRR, -Homans ABD: +BS, NT NEURO: AAO x 3   ED Course  Procedures (including critical care time)  Labs Review Labs Reviewed - No data to display  Imaging Review No results found.  ECG- HR 80, wide qrs rhythm, left axis deviation, nonspecific intraverticular block, inferior infarct, age undetermined  MDM   1. Other chest pain   ECG done and reviewed and findings above. Agree with reading of print out version of ECG. No pain currently while evaluating patient,  IV placed, 2 L O2 nasal cannula placed, 4 baby aspirin given to patient, will call EMS to have patient transported to the ER for further evaluation and treatment.    Jolene Provost, MD 06/11/15 1413  Jolene Provost, MD 06/14/15  1148 

## 2015-06-11 NOTE — ED Notes (Signed)
Pt from [facility] urgent care, C/O intermittent CP, received pacemaker September 2016. Denies chest pain on assessment. States " the pain goes away as quick as it comes on" pain 3/10 when it occurs

## 2015-06-11 NOTE — ED Notes (Signed)
Dr. Pershing Proud in to speak with pt and family regarding results.

## 2015-06-11 NOTE — ED Notes (Signed)
Pt reports chest pain started about 2 days ago, maybe a few pains before then, Left side chest. Denies any radiating pain, n/v./d.

## 2016-10-16 ENCOUNTER — Encounter: Payer: Self-pay | Admitting: Emergency Medicine

## 2016-10-16 ENCOUNTER — Inpatient Hospital Stay
Admission: EM | Admit: 2016-10-16 | Discharge: 2016-10-17 | DRG: 643 | Disposition: A | Discharge status: Home / Self Care | Payer: Medicare Other | Attending: Internal Medicine | Admitting: Internal Medicine

## 2016-10-16 ENCOUNTER — Inpatient Hospital Stay: Payer: Medicare Other

## 2016-10-16 DIAGNOSIS — J9601 Acute respiratory failure with hypoxia: Secondary | ICD-10-CM | POA: Diagnosis present

## 2016-10-16 DIAGNOSIS — R05 Cough: Secondary | ICD-10-CM | POA: Diagnosis present

## 2016-10-16 DIAGNOSIS — E785 Hyperlipidemia, unspecified: Secondary | ICD-10-CM | POA: Diagnosis present

## 2016-10-16 DIAGNOSIS — E222 Syndrome of inappropriate secretion of antidiuretic hormone: Principal | ICD-10-CM | POA: Diagnosis present

## 2016-10-16 DIAGNOSIS — Z79899 Other long term (current) drug therapy: Secondary | ICD-10-CM

## 2016-10-16 DIAGNOSIS — Z66 Do not resuscitate: Secondary | ICD-10-CM | POA: Diagnosis present

## 2016-10-16 DIAGNOSIS — E871 Hypo-osmolality and hyponatremia: Secondary | ICD-10-CM

## 2016-10-16 DIAGNOSIS — K449 Diaphragmatic hernia without obstruction or gangrene: Secondary | ICD-10-CM | POA: Diagnosis present

## 2016-10-16 DIAGNOSIS — I10 Essential (primary) hypertension: Secondary | ICD-10-CM | POA: Diagnosis present

## 2016-10-16 DIAGNOSIS — Z7982 Long term (current) use of aspirin: Secondary | ICD-10-CM

## 2016-10-16 DIAGNOSIS — Z950 Presence of cardiac pacemaker: Secondary | ICD-10-CM | POA: Diagnosis not present

## 2016-10-16 DIAGNOSIS — E039 Hypothyroidism, unspecified: Secondary | ICD-10-CM | POA: Diagnosis present

## 2016-10-16 DIAGNOSIS — R059 Cough, unspecified: Secondary | ICD-10-CM

## 2016-10-16 HISTORY — DX: Hypo-osmolality and hyponatremia: E87.1

## 2016-10-16 LAB — BASIC METABOLIC PANEL
ANION GAP: 8 (ref 5–15)
Anion gap: 9 (ref 5–15)
BUN: 10 mg/dL (ref 6–20)
BUN: 18 mg/dL (ref 6–20)
CALCIUM: 8.6 mg/dL — AB (ref 8.9–10.3)
CO2: 27 mmol/L (ref 22–32)
CO2: 25 mmol/L (ref 22–32)
Calcium: 9.1 mg/dL (ref 8.9–10.3)
Chloride: 90 mmol/L — ABNORMAL LOW (ref 101–111)
Chloride: 94 mmol/L — ABNORMAL LOW (ref 101–111)
Creatinine, Ser: 0.47 mg/dL (ref 0.44–1.00)
Creatinine, Ser: 0.67 mg/dL (ref 0.44–1.00)
GFR calc Af Amer: >60 mL/min (ref >60)
GLUCOSE: 139 mg/dL — AB (ref 65–99)
Glucose, Bld: 113 mg/dL — ABNORMAL HIGH (ref 65–99)
POTASSIUM: 4.5 mmol/L (ref 3.5–5.1)
Potassium: 4.2 mmol/L (ref 3.5–5.1)
SODIUM: 125 mmol/L — AB (ref 135–145)
SODIUM: 128 mmol/L — AB (ref 135–145)

## 2016-10-16 LAB — CBC WITH DIFFERENTIAL/PLATELET
BASOS ABS: 0 10*3/uL (ref 0–0.1)
Basophils Relative: 0 %
EOS ABS: 0 10*3/uL (ref 0–0.7)
EOS PCT: 1 %
HCT: 39 % (ref 35.0–47.0)
Hemoglobin: 13.6 g/dL (ref 12.0–16.0)
Lymphocytes Relative: 15 %
Lymphs Abs: 0.6 10*3/uL — ABNORMAL LOW (ref 1.0–3.6)
MCH: 30.3 pg (ref 26.0–34.0)
MCHC: 35 g/dL (ref 32.0–36.0)
MCV: 86.6 fL (ref 80.0–100.0)
Monocytes Absolute: 0.6 10*3/uL (ref 0.2–0.9)
Monocytes Relative: 15 %
Neutro Abs: 2.8 10*3/uL (ref 1.4–6.5)
Neutrophils Relative %: 69 %
PLATELETS: 310 10*3/uL (ref 150–440)
RBC: 4.5 MIL/uL (ref 3.80–5.20)
RDW: 14.5 % (ref 11.5–14.5)
WBC: 4.1 10*3/uL (ref 3.6–11.0)

## 2016-10-16 LAB — TSH: TSH: 1.959 u[IU]/mL (ref 0.350–4.500)

## 2016-10-16 LAB — COMPREHENSIVE METABOLIC PANEL
ALT: 15 U/L (ref 14–54)
AST: 19 U/L (ref 15–41)
Albumin: 3.9 g/dL (ref 3.5–5.0)
Alkaline Phosphatase: 119 U/L (ref 38–126)
Anion gap: 10 (ref 5–15)
BUN: 14 mg/dL (ref 6–20)
CHLORIDE: 88 mmol/L — AB (ref 101–111)
CO2: 25 mmol/L (ref 22–32)
CREATININE: 0.51 mg/dL (ref 0.44–1.00)
Calcium: 9.1 mg/dL (ref 8.9–10.3)
GFR calc non Af Amer: >60 mL/min (ref >60)
Glucose, Bld: 111 mg/dL — ABNORMAL HIGH (ref 65–99)
POTASSIUM: 4 mmol/L (ref 3.5–5.1)
Sodium: 123 mmol/L — ABNORMAL LOW (ref 135–145)
Total Bilirubin: 0.7 mg/dL (ref 0.3–1.2)
Total Protein: 7.3 g/dL (ref 6.5–8.1)

## 2016-10-16 LAB — TROPONIN I

## 2016-10-16 LAB — SODIUM, URINE, RANDOM: Sodium, Ur: 62 mmol/L

## 2016-10-16 LAB — LIPASE, BLOOD: Lipase: 25 U/L (ref 11–51)

## 2016-10-16 MED ORDER — ACETAMINOPHEN 325 MG PO TABS: 650.0000 mg | ORAL_TABLET | Freq: Four times a day (QID) | ORAL | Status: DC | PRN

## 2016-10-16 MED ORDER — SODIUM CHLORIDE 0.9 % IV BOLUS (SEPSIS)
1000.0000 mL | Freq: Once | INTRAVENOUS | Status: AC
  Administered 2016-10-16: 1000 mL via INTRAVENOUS

## 2016-10-16 MED ORDER — AMLODIPINE BESYLATE 5 MG PO TABS
5.0000 mg | ORAL_TABLET | Freq: Every day | ORAL | Status: DC
Start: 2016-10-16 — End: 2016-10-17
  Filled 2016-10-16: qty 1

## 2016-10-16 MED ORDER — FUROSEMIDE 20 MG PO TABS
20.0000 mg | ORAL_TABLET | Freq: Every day | ORAL | Status: DC
  Administered 2016-10-16 – 2016-10-17 (×2): 20 mg via ORAL
  Filled 2016-10-16 (×2): qty 1

## 2016-10-16 MED ORDER — IOPAMIDOL (ISOVUE-300) INJECTION 61%
100.0000 mL | Freq: Once | INTRAVENOUS | Status: AC | PRN
  Administered 2016-10-16: 100 mL via INTRAVENOUS

## 2016-10-16 MED ORDER — SODIUM CHLORIDE 0.9 % IV BOLUS (SEPSIS)
1000.0000 mL | Freq: Once | INTRAVENOUS | Status: AC
  Administered 2016-10-16: 22:00:00 1000 mL via INTRAVENOUS

## 2016-10-16 MED ORDER — ENSURE ENLIVE PO LIQD
237.0000 mL | ORAL | Status: DC
  Administered 2016-10-16: 237 mL via ORAL

## 2016-10-16 MED ORDER — IOPAMIDOL (ISOVUE-300) INJECTION 61%
30.0000 mL | Freq: Once | INTRAVENOUS | Status: AC | PRN
  Administered 2016-10-16: 30 mL via ORAL

## 2016-10-16 MED ORDER — ONDANSETRON HCL 4 MG/2ML IJ SOLN
4.0000 mg | Freq: Once | INTRAMUSCULAR | Status: AC
  Administered 2016-10-16: 4 mg via INTRAVENOUS

## 2016-10-16 MED ORDER — ENOXAPARIN SODIUM 40 MG/0.4ML ~~LOC~~ SOLN
40.0000 mg | SUBCUTANEOUS | Status: DC
  Administered 2016-10-16 – 2016-10-17 (×2): 40 mg via SUBCUTANEOUS
  Filled 2016-10-16 (×2): qty 0.4

## 2016-10-16 MED ORDER — IPRATROPIUM-ALBUTEROL 0.5-2.5 (3) MG/3ML IN SOLN
3.0000 mL | Freq: Three times a day (TID) | RESPIRATORY_TRACT | Status: DC
  Administered 2016-10-17: 07:00:00 3 mL via RESPIRATORY_TRACT
  Filled 2016-10-16 (×2): qty 3

## 2016-10-16 MED ORDER — LEVOTHYROXINE SODIUM 112 MCG PO TABS
112.0000 ug | ORAL_TABLET | Freq: Every day | ORAL | Status: DC
  Administered 2016-10-17: 112 ug via ORAL
  Filled 2016-10-16: qty 1

## 2016-10-16 MED ORDER — LISINOPRIL 10 MG PO TABS
20.0000 mg | ORAL_TABLET | Freq: Every day | ORAL | Status: DC
Start: 2016-10-16 — End: 2016-10-17

## 2016-10-16 MED ORDER — ASPIRIN EC 81 MG PO TBEC
81.0000 mg | DELAYED_RELEASE_TABLET | Freq: Every day | ORAL | Status: DC
  Administered 2016-10-16 – 2016-10-17 (×2): 81 mg via ORAL
  Filled 2016-10-16 (×2): qty 1

## 2016-10-16 MED ORDER — SODIUM CHLORIDE 0.9 % IV SOLN
INTRAVENOUS | Status: DC
  Administered 2016-10-16: 11:00:00 via INTRAVENOUS

## 2016-10-16 MED ORDER — POTASSIUM CHLORIDE 20 MEQ PO PACK
20.0000 meq | PACK | Freq: Every day | ORAL | Status: DC
  Administered 2016-10-16 – 2016-10-17 (×2): 20 meq via ORAL
  Filled 2016-10-16 (×2): qty 1

## 2016-10-16 MED ORDER — ONDANSETRON HCL 4 MG PO TABS: 4.0000 mg | ORAL_TABLET | Freq: Four times a day (QID) | ORAL | Status: DC | PRN

## 2016-10-16 MED ORDER — ADULT MULTIVITAMIN W/MINERALS CH
1.0000 | ORAL_TABLET | Freq: Every day | ORAL | Status: DC
Start: 2016-10-17 — End: 2016-10-17
  Administered 2016-10-17: 1 via ORAL
  Filled 2016-10-16: qty 1

## 2016-10-16 MED ORDER — ONDANSETRON HCL 4 MG/2ML IJ SOLN: 4.0000 mg | Freq: Four times a day (QID) | INTRAMUSCULAR | Status: DC | PRN

## 2016-10-16 MED ORDER — ONDANSETRON HCL 4 MG/2ML IJ SOLN
INTRAMUSCULAR | Status: AC
  Filled 2016-10-16: qty 2

## 2016-10-16 MED ORDER — ACETAMINOPHEN 650 MG RE SUPP: 650.0000 mg | Freq: Four times a day (QID) | RECTAL | Status: DC | PRN

## 2016-10-16 MED ORDER — IPRATROPIUM-ALBUTEROL 0.5-2.5 (3) MG/3ML IN SOLN
3.0000 mL | Freq: Four times a day (QID) | RESPIRATORY_TRACT | Status: DC
  Administered 2016-10-16 (×2): 3 mL via RESPIRATORY_TRACT
  Filled 2016-10-16 (×2): qty 3

## 2016-10-16 NOTE — ED Notes (Signed)
This RN to bedside with Laura Delacruz, Nurse Extern, introduced selves to patient and family. Pt placed on bedpan per request, then repositioned in the bed for patient comfort. Pt continues to drink oral contrast given to her. Pt is alert and oriented, respirations even and unlabored, skin warm, dry, and intact. Pt denies further needs at this time.

## 2016-10-16 NOTE — Consult Note (Signed)
Date: 10/16/2016                  Patient Name:  Laura Delacruz  MRN: [id number]  DOB: 1926-11-21  Age / Sex: 83 y.o., female         PCP: Leim Fabry, MD                 Service Requesting Consult: Hospitalist/ Alford Highland, MD                 Reason for Consult: Hyponatremia            History of Present Illness: Patient is a 83 y.o. female with medical problems of hypertension, hyponatremia, hyperlipidemia, thyroid disorder, who was admitted to [facility] on 10/16/2016 for evaluation of weakness and nausea. Patient states that she was admitted to [facility] for left hip pain and low back pain with sciatica. She was in rehabilitation for several days.  She just got out few days ago. She has chronic hyponatremia.  States that her appetite has been very poor over the possible weeks.  She does not know if she has lost any weight. She currently resides at home.  Her daughter-in-law cooks for her. She now presents for nausea and poor appetite for several days. Admission sodium 123.  Treated with IV normal saline.  Today sodium has increased to 125.  Serum creatinine is normal at 0.47.  BUN 10 No leg edema.  No shortness of breath.   Medications: Outpatient medications: Prescriptions Prior to Admission  Medication Sig Dispense Refill Last Dose  . amLODipine (NORVASC) 5 MG tablet Take 5 mg by mouth daily.   10/15/2016 at 2000  . aspirin 81 MG tablet Take 81 mg by mouth daily.   10/15/2016 at 0800  . Cholecalciferol (VITAMIN D-3) 1000 units CAPS Take by mouth.   10/15/2016 at 0800  . levothyroxine (SYNTHROID, LEVOTHROID) 112 MCG tablet Take 112 mcg by mouth daily before breakfast.   10/15/2016 at 0800  . lisinopril (PRINIVIL,ZESTRIL) 20 MG tablet Take 20 mg by mouth daily.   10/16/2016 at 0400  . potassium chloride (KLOR-CON) 20 MEQ packet Take 20 mEq by mouth daily.    10/15/2016 at 0800    Current medications: Current Facility-Administered Medications  Medication Dose Route Frequency  Provider Last Rate Last Dose  . 0.9 %  sodium chloride infusion   Intravenous Continuous Alford Highland, MD 60 mL/hr at 10/16/16 1045    . acetaminophen (TYLENOL) tablet 650 mg  650 mg Oral Q6H PRN Alford Highland, MD       Or  . acetaminophen (TYLENOL) suppository 650 mg  650 mg Rectal Q6H PRN Wieting, Richard, MD      . amLODipine (NORVASC) tablet 5 mg  5 mg Oral Daily Wieting, Richard, MD      . aspirin EC tablet 81 mg  81 mg Oral Daily Alford Highland, MD   81 mg at 10/16/16 1044  . enoxaparin (LOVENOX) injection 40 mg  40 mg Subcutaneous Q24H Alford Highland, MD   40 mg at 10/16/16 1044  . ipratropium-albuterol (DUONEB) 0.5-2.5 (3) MG/3ML nebulizer solution 3 mL  3 mL Nebulization Q6H Wieting, Richard, MD   3 mL at 10/16/16 1336  . [START ON 10/17/2016] levothyroxine (SYNTHROID, LEVOTHROID) tablet 112 mcg  112 mcg Oral QAC breakfast Wieting, Richard, MD      . lisinopril (PRINIVIL,ZESTRIL) tablet 20 mg  20 mg Oral Daily Alford Highland, MD      . ondansetron [facility])  tablet 4 mg  4 mg Oral Q6H PRN Alford Highland, MD       Or  . ondansetron [facility]) injection 4 mg  4 mg Intravenous Q6H PRN Wieting, Richard, MD      . potassium chloride (KLOR-CON) packet 20 mEq  20 mEq Oral Daily Alford Highland, MD   20 mEq at 10/16/16 1044      Allergies: No Known Allergies    Past Medical History: Past Medical History:  Diagnosis Date  . Hyperlipidemia   . Hypertension   . Hyponatremia   . Irregular heart rate   . Thyroid disease      Past Surgical History: Past Surgical History:  Procedure Laterality Date  . APPENDECTOMY    . CARDIAC SURGERY    . PACEMAKER INSERTION Left 2016     Family History: Family History  Problem Relation Age of Onset  . AAA (abdominal aortic aneurysm) Mother      Social History: Social History   Social History  . Marital status: Widowed    Spouse name: N/A  . Number of children: N/A  . Years of education: N/A   Occupational History  .  Not on file.   Social History Main Topics  . Smoking status: Never Smoker  . Smokeless tobacco: Never Used  . Alcohol use No  . Drug use: Unknown  . Sexual activity: Not on file   Other Topics Concern  . Not on file   Social History Narrative  . No narrative on file     Review of Systems: Gen: no fevers or chills.  Not sure about weight loss. HEENT: no vision or hearing problems CV: no chest pain or shortness of breath reported Resp: no cough or sputum production GI:no diarrhea.  Reports nausea.  Poor appetite. GU : no problems with voiding.  No history of kidney stones or hematuria. MS: left hip pain and low back pain Derm:  no complaints Psych:no complaints Heme: no complaints Neuro: no complaints Endocrine.  No complaints  Vital Signs: Blood pressure (!) 158/78, pulse 84, temperature 98.6 F (37 C), temperature source Oral, resp. rate 18, height 5\' 3"  (1.6 m), weight 57.8 kg (127 lb 8 oz), SpO2 96 %.   Intake/Output Summary (Last 24 hours) at 10/16/16 1414 Last data filed at 10/16/16 1200  Gross per 24 hour  Intake                0 ml  Output                0 ml  Net                0 ml    Weight trends: Filed Weights   10/16/16 0442 10/16/16 1023  Weight: 57.6 kg (127 lb) 57.8 kg (127 lb 8 oz)    Physical Exam: General:  elderly, frail lady, laying in the bed  HEENT Anicteric, moist oral mucous membranes  Neck: supple, no JVD  Lungs: Normal breathing effort, clear to auscultation  Heart::  irregular, no rub or gallop, crescendo systolic murmur  Abdomen: Soft, nontender, nondistended  Extremities:  no edema  Neurologic: Alert, oriented  Skin: No rashes             Lab results: Basic Metabolic Panel:  Recent Labs Lab 10/16/16 0538 10/16/16 1157  NA 123* 125*  K 4.0 4.5  CL 88* 90*  CO2 25 27  GLUCOSE 111* 113*  BUN 14 10  CREATININE 0.51 0.47  CALCIUM 9.1 9.1    Liver Function Tests:  Recent Labs Lab 10/16/16 0538  AST 19  ALT  15  ALKPHOS 119  BILITOT 0.7  PROT 7.3  ALBUMIN 3.9    Recent Labs Lab 10/16/16 0538  LIPASE 25   No results for input(s): AMMONIA in the last 168 hours.  CBC:  Recent Labs Lab 10/16/16 0538  WBC 4.1  NEUTROABS 2.8  HGB 13.6  HCT 39.0  MCV 86.6  PLT 310    Cardiac Enzymes:  Recent Labs Lab 10/16/16 1157  TROPONINI <0.03    BNP: Invalid input(s): POCBNP  CBG: No results for input(s): GLUCAP in the last 168 hours.  Microbiology: No results found for this or any previous visit (from the past 720 hour(s)).   Coagulation Studies: No results for input(s): LABPROT, INR in the last 72 hours.  Urinalysis: No results for input(s): COLORURINE, LABSPEC, PHURINE, GLUCOSEU, HGBUR, BILIRUBINUR, KETONESUR, PROTEINUR, UROBILINOGEN, NITRITE, LEUKOCYTESUR in the last 72 hours.  Invalid input(s): APPERANCEUR      Imaging: Dg Chest 1 View  Result Date: 10/16/2016 CLINICAL DATA:  Nausea and vomiting over night. History of hypertension, permanent pacemaker, nonsmoker. EXAM: CHEST 1 VIEW COMPARISON:  Portable chest x-ray of June 11, 2015 FINDINGS: The lungs are well-expanded and clear. The heart is top-normal in size. The pulmonary vascularity is normal. There is calcification in the wall of the aortic arch. The ICD is in stable position. The observed bony thorax exhibits no acute abnormality. No free subdiaphragmatic gas collections are observed. IMPRESSION: There is no acute cardiopulmonary abnormality. Thoracic aortic atherosclerosis. Electronically Signed   By: David  SwazilandJordan M.D.   On: 10/16/2016 09:07   Ct Abdomen Pelvis W Contrast  Result Date: 10/16/2016 CLINICAL DATA:  Acute onset nausea last night. EXAM: CT ABDOMEN AND PELVIS WITH CONTRAST TECHNIQUE: Multidetector CT imaging of the abdomen and pelvis was performed using the standard protocol following bolus administration of intravenous contrast. CONTRAST:  100 ml ISOVUE-300 IOPAMIDOL (ISOVUE-300) INJECTION 61%  COMPARISON:  CT abdomen and pelvis 02/03/2014. FINDINGS: Lower chest: Lung bases are clear. No pleural or pericardial effusion. Cardiomegaly noted. Hepatobiliary: No focal liver abnormality is seen. No gallstones, gallbladder wall thickening, or biliary dilatation. Pancreas: Unremarkable. No pancreatic ductal dilatation or surrounding inflammatory changes. Spleen: Normal in size without focal abnormality. Adrenals/Urinary Tract: Adrenal glands are unremarkable. Kidneys are normal, without renal calculi, focal lesion, or hydronephrosis. Bladder is unremarkable. Stomach/Bowel: Small hiatal hernia is seen. Stomach is otherwise within normal limits. The patient is status post appendectomy. No evidence of bowel wall thickening, distention, or inflammatory changes. Vascular/Lymphatic: No lymphadenopathy. Aortoiliac atherosclerosis without aneurysm noted. Reproductive: Uterus and bilateral adnexa are unremarkable. Other: No fluid collection. Musculoskeletal: T12, L3 and L4 compression fractures are unchanged. The patient has a superior endplate compression fracture of L2 which is new since the prior CT scan but appears remote. Fracture fixation hardware left hip is noted. No acute bony abnormality. IMPRESSION: No acute abnormality abdomen or pelvis. Small hiatal hernia. Atherosclerosis. Cardiomegaly. L2 superior endplate compression fracture is new since 2015 but has remote appearance. Electronically Signed   By: Drusilla Kannerhomas  Dalessio M.D.   On: 10/16/2016 09:59      Assessment & Plan: Pt is a 83 y.o. caucasian  female with HTN, osteoporosis, hypothyroidism, SSS s/p PPM, Lhip fracture s/p IM nail (c/b infection requiring washout)in 2012, and lumbosacral spinal stenosis (s/p epidural steroid injection 2013 , was admitted on 10/16/2016 with  Nausea and hyponatremia.   1. Chronic hyponatremia  with recent worsening Patient's volume status appears euvolemic.  Some possibility of volume depletion as patient had poor intake  and is reporting nausea.  Sodium level has improved slightly with IV normal saline. 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  Underlying cause unknown Thyroid function is normal  Plan: Serum osmolality, urine osmolality, urine sodium SPEP, UPEP Continue low-dose IV normal saline but Will add low dose Lasix Continue to monitor BMP closely

## 2016-10-16 NOTE — Progress Notes (Signed)
Spoke with Dr Anne HahnWillis regarding patient's low blood pressure of 88/46 manually.  1 Liter bolus ordered. Hold Amlodipine. Henriette CombsSarah Rayah Fines RN

## 2016-10-16 NOTE — Evaluation (Signed)
Physical Therapy Evaluation Patient Details Name: Laura Delacruz MRN: [id number] DOB: 10-Jul-1926 Today's Date: 10/16/2016   History of Present Illness  Pt is a 83 y/o F who presented with weakness and nausea.  In the ER her sodium was found to be 123 and pt admitted with hyponatremia likely secondary to poor appetite.  Pt's PMH includes pacemaker insertion.    Clinical Impression  Pt admitted with above diagnosis. Pt currently with functional limitations due to the deficits listed below (see PT Problem List). Mrs. Dudzik had returned home from 23 day SNF stay on 5/24 and appears to be at same level of mobility, per pt's history, as when she was d/c.  She ambulated 200 ft with supervision and cues for upright posture.  She was set up for but had not yet received HHPT after returning home from SNF.  She has excellent family support available 24/7 if needed. Pt will benefit from skilled PT to increase their independence and safety with mobility to allow discharge to the venue listed below.      Follow Up Recommendations Home health PT    Equipment Recommendations  None recommended by PT    Recommendations for Other Services       Precautions / Restrictions Precautions Precautions: Fall Restrictions Weight Bearing Restrictions: No      Mobility  Bed Mobility Overal bed mobility: Independent             General bed mobility comments: No physical assist or cues needed.  Pt performs independently.  Transfers Overall transfer level: Needs assistance Equipment used: Rolling walker (2 wheeled) Transfers: Sit to/from Stand Sit to Stand: Supervision         General transfer comment: Supervision for safety.  Pt performs with proper hand placement and safe technique.  Ambulation/Gait Ambulation/Gait assistance: Supervision Ambulation Distance (Feet): 200 Feet Assistive device: Rolling walker (2 wheeled) Gait Pattern/deviations: Step-through pattern;Decreased stride  length;Trunk flexed   Gait velocity interpretation: at or above normal speed for age/gender General Gait Details: Cues for upright posture and to keep RW closer to her as pt has trunk flexion tendencies.  Supervision for safety.  No rest breaks needed.  Stairs            Wheelchair Mobility    Modified Rankin (Stroke Patients Only)       Balance Overall balance assessment: Needs assistance Sitting-balance support: No upper extremity supported;Feet supported Sitting balance-Leahy Scale: Good     Standing balance support: No upper extremity supported;During functional activity Standing balance-Leahy Scale: Fair Standing balance comment: Pt able to stand statically without UE support but requires UE support for dynamic activities                             Pertinent Vitals/Pain Pain Assessment: No/denies pain    Home Living Family/patient expects to be discharged to:: Private residence Living Arrangements: Alone Available Help at Discharge: Family;Available 24 hours/day Type of Home: House Home Access: Stairs to enter   [employer] of Steps: 2 Home Layout: One level Home Equipment: Walker - 2 wheels;Walker - 4 wheels;Shower seat;Bedside commode;Grab bars - toilet;Grab bars - tub/shower;Hand held shower head      Prior Function Level of Independence: Needs assistance   Gait / Transfers Assistance Needed: Pt returned home from a 23 day SNF stay on 5/24.  She was ambulating with RW and denies any falls in the past 6 months.    ADL's /  Homemaking Assistance Needed: Pt was receiving assist for bathing at SNF and has not tried to do this yet at home.  She is independent with dressing.  Has someone clean her home.        Hand Dominance   Dominant Hand: Right    Extremity/Trunk Assessment   Upper Extremity Assessment Upper Extremity Assessment:  (BUE strength grossly 4/5)    Lower Extremity Assessment Lower Extremity Assessment: Overall  WFL for tasks assessed    Cervical / Trunk Assessment Cervical / Trunk Assessment: Kyphotic  Communication   Communication: No difficulties  Cognition Arousal/Alertness: Awake/alert Behavior During Therapy: WFL for tasks assessed/performed Overall Cognitive Status: Within Functional Limits for tasks assessed                                        General Comments      Exercises General Exercises - Upper Extremity Shoulder Flexion: AROM;Both;10 reps;Seated General Exercises - Lower Extremity Ankle Circles/Pumps: AROM;Both;10 reps;Seated Long Arc Quad: AROM;Both;10 reps;Seated Other Exercises Other Exercises: Encouraged pt to ambulate at least 3x/day with nursing staff.   Assessment/Plan    PT Assessment Patient needs continued PT services  PT Problem List Decreased strength;Decreased activity tolerance;Decreased balance;Decreased mobility;Decreased knowledge of use of DME;Decreased safety awareness       PT Treatment Interventions DME instruction;Gait training;Functional mobility training;Therapeutic activities;Therapeutic exercise;Balance training;Stair training;Patient/family education;Neuromuscular re-education    PT Goals (Current goals can be found in the Care Plan section)  Acute Rehab PT Goals Patient Stated Goal: to go home PT Goal Formulation: With patient Time For Goal Achievement: 10/30/16 Potential to Achieve Goals: Good    Frequency Min 2X/week   Barriers to discharge        Co-evaluation               AM-PAC PT "6 Clicks" Daily Activity  Outcome Measure Difficulty turning over in bed (including adjusting bedclothes, sheets and blankets)?: None Difficulty moving from lying on back to sitting on the side of the bed? : None Difficulty sitting down on and standing up from a chair with arms (e.g., wheelchair, bedside commode, etc,.)?: A Little Help needed moving to and from a bed to chair (including a wheelchair)?: A Little Help  needed walking in hospital room?: A Little Help needed climbing 3-5 steps with a railing? : A Little 6 Click Score: 20    End of Session Equipment Utilized During Treatment: Gait belt Activity Tolerance: Patient tolerated treatment well Patient left: in chair;with call bell/phone within reach;with chair alarm set Nurse Communication: Mobility status PT Visit Diagnosis: Muscle weakness (generalized) (M62.81);Unsteadiness on feet (R26.81)    Time: [id number] PT Time Calculation (min) (ACUTE ONLY): 22 min   Charges:   PT Evaluation $PT Eval Low Complexity: 1 Procedure     PT G Codes:        Encarnacion ChuAshley Abashian PT, DPT 10/16/2016, 3:29 PM

## 2016-10-16 NOTE — ED Triage Notes (Signed)
Pt to triage via w/c with no distress noted; pt reports nausea tonight, denies vomiting, denies pain

## 2016-10-16 NOTE — ED Notes (Signed)
This RN notified that patient needed change of sheets. Pt visualized in NAD at this time. Denies further needs. Will continue to monitor for further patient needs.

## 2016-10-16 NOTE — Progress Notes (Signed)
Initial Nutrition Assessment  DOCUMENTATION CODES:   Not applicable  INTERVENTION:  Provide Ensure Enlive po once daily, each supplement provides 350 kcal and 20 grams of protein.  Recommend multivitamin with minerals daily.  Encouraged adequate intake of calories and protein at meals. Reviewed protein options patient enjoys.  NUTRITION DIAGNOSIS:   Inadequate protein intake related to other (see comment) (dietary preferences) as evidenced by per patient/family report (only eats meat 2-3 times per week).  GOAL:   Patient will meet greater than or equal to 90% of their needs  MONITOR:   PO intake, Supplement acceptance, Labs, Weight trends, I & O's  REASON FOR ASSESSMENT:   Consult Assessment of nutrition requirement/status (and Poor PO)  ASSESSMENT:   [age] year old female with PMHx of HTN, HLD, thyroid disease, hyponatremia presents with weakness and nausea, found to have hyponatremia (possibly SIADH).   Spoke with patient at bedside. Patient reports she just got home from rehab last Thursday. Her daughter-in-law is preparing meals for her at home. She reports her appetite may have been poor lately, but that she is still eating. Reports having scrambled eggs with bacon or cereal or cheese toast for breakfast. Lunch and dinner are mainly vegetables. She eats meat only 2-3 times per week, but reports she will eat other protein foods including beans and peanut butter or dairy. She drinks one bottle of Ensure Original daily. She reports she occasionally has nausea, which she believes is from her medications. She reports it is improved now.   Denies any weight loss. Reports her UBW is 127 lbs. Weight stable in chart.  Meal Completion: 50% of lunch today (308 kcal, 15 grams of protein)  Medications reviewed and include: Lasix 20 mg daily, levothyroxine, potassium chloride 20 mEq daily, NS @ 60 ml/hr.  Labs reviewed: Sodium 125, Chloride 90.   Nutrition-Focused physical exam  completed. Findings are mild-moderate fat depletion in upper arm region, mild-moderate muscle depletion, and no edema. Some level of muscle wasting expected with aging. Could also be related to limited intake of protein.  Patient does not meet criteria for malnutrition at this time.  Diet Order:  Diet regular Room service appropriate? Yes; Fluid consistency: Thin  Skin:  Reviewed, no issues  Last BM:  PTA  Height:   Ht Readings from Last 1 Encounters:  10/16/16 5\' 3"  (1.6 m)    Weight:   Wt Readings from Last 1 Encounters:  10/16/16 127 lb 8 oz (57.8 kg)    Ideal Body Weight:  52.3 kg  BMI:  Body mass index is 22.59 kg/m.  Estimated Nutritional Needs:   Kcal:  1185-1385 (MSJ x 1.2-1.4)  Protein:  70-80 grams (1.2-1.4 grams/kg)  Fluid:  1.4 L/day (25 ml/kg)  EDUCATION NEEDS:   Education needs addressed (Encouraged a source of protein at each meal. Continue daily Ensure.)  Helane RimaLeanne Tida Saner, MS, RD, LDN Pager: [phone] After Hours Pager: [phone]

## 2016-10-16 NOTE — ED Provider Notes (Signed)
[facility] Emergency Department Provider Note    First MD Initiated Contact with Patient 10/16/16 [phone]     (approximate)  I have reviewed the triage vital signs and the nursing notes.   HISTORY  Chief Complaint Nausea    HPI Laura Delacruz is a 83 y.o. female with bolus of chronic medical conditions presents to the emergency department with acute onset of painless nausea tonight. Patient denies any vomiting no diarrhea no constipation no abdominal pain. Patient denies any fever afebrile on presentation temperature 97.7. Patient states that she is currently on a water restriction and has been on this since being discharged from [facility]. Patient states that following a hospitalization at [facility] she was sent to [facility] for physical therapy. Patient states that she is drinking approximately one 8 ounce cup of water daily    Past Medical History:  Diagnosis Date  . Hyperlipidemia   . Hypertension   . Irregular heart rate   . Thyroid disease     There are no active problems to display for this patient.   Past Surgical History:  Procedure Laterality Date  . APPENDECTOMY    . CARDIAC SURGERY    . PACEMAKER INSERTION Left 2016    Prior to Admission medications   Medication Sig Start Date End Date Taking? Authorizing Provider  amLODipine (NORVASC) 5 MG tablet Take 5 mg by mouth daily.   Yes [provider]  aspirin 81 MG tablet Take 81 mg by mouth daily.   Yes [provider]  Cholecalciferol (VITAMIN D-3) 1000 units CAPS Take by mouth.   Yes [provider]  levothyroxine (SYNTHROID, LEVOTHROID) 112 MCG tablet Take 112 mcg by mouth daily before breakfast.   Yes [provider]  lisinopril (PRINIVIL,ZESTRIL) 20 MG tablet Take 20 mg by mouth daily.   Yes [provider]  potassium chloride (KLOR-CON) 20 MEQ packet Take 20 mEq by mouth daily.    Yes [provider]     Allergies No known drug allergies  No family history on file.  Social History Social History  Substance Use Topics  . Smoking status: Never Smoker  . Smokeless tobacco: Never Used  . Alcohol use No    Review of Systems Constitutional: No fever/chills Eyes: No visual changes. ENT: No sore throat. Cardiovascular: Denies chest pain. Respiratory: Denies shortness of breath. Gastrointestinal: No abdominal pain.  Positive for nausea, no vomiting.  No diarrhea.  No constipation. Genitourinary: Negative for dysuria. Musculoskeletal: Negative for neck pain.  Negative for back pain. Integumentary: Negative for rash. Neurological: Negative for headaches, focal weakness or numbness.   ____________________________________________   PHYSICAL EXAM:  VITAL SIGNS: ED Triage Vitals  Enc Vitals Group     BP 10/16/16 0441 (!) 160/78     Pulse Rate 10/16/16 0441 92     Resp 10/16/16 0441 20     Temp 10/16/16 0441 97.7 F (36.5 C)     Temp Source 10/16/16 0441 Oral     SpO2 10/16/16 0441 97 %     Weight 10/16/16 0442 57.6 kg (127 lb)     Height 10/16/16 0442 1.626 m (5\' 4" )     Head Circumference --      Peak Flow --      Pain Score 10/16/16 0512 2     Pain Loc --      Pain Edu? --      Excl. in GC? --     Constitutional: Alert and  oriented. Well appearing and in no acute distress. Eyes: Conjunctivae are normal. Head: Atraumatic. Mouth/Throat: Mucous membranes are moist. Oropharynx non-erythematous. Neck: No stridor.   Cardiovascular: Normal rate, regular rhythm. Good peripheral circulation. Grossly normal heart sounds. Respiratory: Normal respiratory effort.  No retractions. Lungs CTAB. Gastrointestinal: Epigastric tenderness to palpation. No distention.  Musculoskeletal: No lower extremity tenderness nor edema. No gross deformities of extremities. Neurologic:  Normal speech and language. No gross focal neurologic deficits are appreciated.  Skin:  Skin is warm, dry and  intact. No rash noted. Psychiatric: Mood and affect are normal. Speech and behavior are normal. ____________________________________________   LABS (all labs ordered are listed, but only abnormal results are displayed)  Labs Reviewed  CBC WITH DIFFERENTIAL/PLATELET - Abnormal; Notable for the following:       Result Value   Lymphs Abs 0.6 (*)    All other components within normal limits  COMPREHENSIVE METABOLIC PANEL - Abnormal; Notable for the following:    Sodium 123 (*)    Chloride 88 (*)    Glucose, Bld 111 (*)    All other components within normal limits  LIPASE, BLOOD  TROPONIN I   ____________________________________________  EKG  ED ECG REPORT I, [place] N BROWN, the attending physician, personally viewed and interpreted this ECG.   Date: 10/16/2016  EKG Time: 5:35 AM  Rate: 75  Rhythm: Normal sinus rhythm with a left bundle branch block  Axis: Left axis deviation  Intervals: Prolonged PR interval  ST&T Change: None   Procedures   ____________________________________________   INITIAL IMPRESSION / ASSESSMENT AND PLAN / ED COURSE  Pertinent labs & imaging results that were available during my care of the patient were reviewed by me and considered in my medical decision making (see chart for details).  83 year old female presenting with nausea laboratory data notable for hyponatremia with sodium of 123. Patient received 1 L IV normal saline we'll continue IV hydration. Patient discussed with Dr. Renae GlossWieting for hospital for further evaluation and management.      ____________________________________________  FINAL CLINICAL IMPRESSION(S) / ED DIAGNOSES  Final diagnoses:  Hyponatremia     MEDICATIONS GIVEN DURING THIS VISIT:  Medications  sodium chloride 0.9 % bolus 1,000 mL (0 mLs Intravenous Stopped 10/16/16 0739)  ondansetron (ZOFRAN) injection 4 mg (4 mg Intravenous Given 10/16/16 0558)  iopamidol (ISOVUE-300) 61 % injection 30 mL (30 mLs Oral  Contrast Given 10/16/16 0656)     NEW OUTPATIENT MEDICATIONS STARTED DURING THIS VISIT:  New Prescriptions   No medications on file    Modified Medications   No medications on file    Discontinued Medications   No medications on file     Note:  This document was prepared using Dragon voice recognition software and may include unintentional dictation errors.    Darci CurrentBrown, [place] N, MD 10/16/16 [phone]

## 2016-10-16 NOTE — ED Notes (Signed)
Fredricka Bonineonnor, Extern at bedside assisting patient with bedpan.

## 2016-10-16 NOTE — H&P (Signed)
[facility] [place] [facility]   PATIENT NAME: Laura DibbleFrances Delacruz    MR#:  [id number]  DATE OF BIRTH:  09/14/1926  DATE OF ADMISSION:  10/16/2016  PRIMARY CARE PHYSICIAN: Leim FabryAldridge, Barbara, MD   REQUESTING/REFERRING PHYSICIAN: Dr Bayard Malesandolph Brown  CHIEF COMPLAINT:   Chief Complaint  Patient presents with  . Nausea    HISTORY OF PRESENT ILLNESS:  Laura DibbleFrances Delacruz  is a 83 y.o. female presenting with feeling very weak and nauseated. She did not sleep last night. Some cough. Last Thursday she got home from rehabilitation. She's been gradually getting weaker over time. She states her appetite has not been as good as it has been in the past. She still trying to eat and drink. Currently in the ER she is drinking CT contrast and feeling very nauseated. In the ER, her sodium was found to be 123 and hospitalist services were contacted for further evaluation.  PAST MEDICAL HISTORY:   Past Medical History:  Diagnosis Date  . Hyperlipidemia   . Hypertension   . Hyponatremia   . Irregular heart rate   . Thyroid disease     PAST SURGICAL HISTORY:   Past Surgical History:  Procedure Laterality Date  . APPENDECTOMY    . CARDIAC SURGERY    . PACEMAKER INSERTION Left 2016    SOCIAL HISTORY:   Social History  Substance Use Topics  . Smoking status: Never Smoker  . Smokeless tobacco: Never Used  . Alcohol use No    FAMILY HISTORY:   Family History  Problem Relation Age of Onset  . AAA (abdominal aortic aneurysm) Mother     DRUG ALLERGIES:  No Known Allergies  REVIEW OF SYSTEMS:  CONSTITUTIONAL: No fever. Positive for fatigue and weakness.  EYES: No blurred or double vision.  EARS, NOSE, AND THROAT: No tinnitus or ear pain. No sore throat. Positive for runny nose RESPIRATORY: Some cough, no shortness of breath, wheezing or hemoptysis.  CARDIOVASCULAR: Slight chest pain, orthopnea, edema.  GASTROINTESTINAL: Positive for nausea, and tightness in  her abdominal pain. No vomiting yet No blood in bowel movements. No constipation or diarrhea GENITOURINARY: No dysuria, hematuria.  ENDOCRINE: No polyuria, nocturia,  HEMATOLOGY: No anemia, easy bruising or bleeding SKIN: No rash or lesion. MUSCULOSKELETAL: Positive for arthritis in her back.   NEUROLOGIC: No tingling, numbness, weakness.  PSYCHIATRY: No anxiety or depression.   MEDICATIONS AT HOME:   Prior to Admission medications   Medication Sig Start Date End Date Taking? Authorizing Provider  amLODipine (NORVASC) 5 MG tablet Take 5 mg by mouth daily.   Yes [provider]  aspirin 81 MG tablet Take 81 mg by mouth daily.   Yes [provider]  Cholecalciferol (VITAMIN D-3) 1000 units CAPS Take by mouth.   Yes [provider]  levothyroxine (SYNTHROID, LEVOTHROID) 112 MCG tablet Take 112 mcg by mouth daily before breakfast.   Yes [provider]  lisinopril (PRINIVIL,ZESTRIL) 20 MG tablet Take 20 mg by mouth daily.   Yes [provider]  potassium chloride (KLOR-CON) 20 MEQ packet Take 20 mEq by mouth daily.    Yes [provider]      VITAL SIGNS:  Blood pressure (!) 158/101, pulse 91, temperature 97.7 F (36.5 C), temperature source Oral, resp. rate 20, height 5\' 4"  (1.626 m), weight 57.6 kg (127 lb), SpO2 (!) 85 %.  PHYSICAL EXAMINATION:  GENERAL:  83 y.o.-year-old patient lying in the bed with no acute distress.  EYES: Pupils equal,  round, reactive to light and accommodation. No scleral icterus. Extraocular muscles intact.  HEENT: Head atraumatic, normocephalic. Oropharynx and nasopharynx clear.  NECK:  Supple, no jugular venous distention. No thyroid enlargement, no tenderness.  LUNGS: Decreased breath sounds bilaterally, no wheezing, rales,rhonchi or crepitation. No use of accessory muscles of respiration.  CARDIOVASCULAR: S1, S2 normal. No murmurs, rubs, or gallops.  ABDOMEN: Soft, nontender, distended. Bowel sounds  present. No organomegaly or mass.  EXTREMITIES: No pedal edema, cyanosis, or clubbing.  NEUROLOGIC: Cranial nerves II through XII are intact. Muscle strength 5/5 in all extremities. Sensation intact. Gait not checked.  PSYCHIATRIC: The patient is alert and oriented x 3.  SKIN: No rash, lesion, or ulcer.   LABORATORY PANEL:   CBC  Recent Labs Lab 10/16/16 0538  WBC 4.1  HGB 13.6  HCT 39.0  PLT 310   ------------------------------------------------------------------------------------------------------------------  Chemistries   Recent Labs Lab 10/16/16 0538  NA 123*  K 4.0  CL 88*  CO2 25  GLUCOSE 111*  BUN 14  CREATININE 0.51  CALCIUM 9.1  AST 19  ALT 15  ALKPHOS 119  BILITOT 0.7   ------------------------------------------------------------------------------------------------------------------  Cardiac Enzymes  Recent Labs Lab 10/16/16 0538  TROPONINI <0.03   ------------------------------------------------------------------------------------------------------------------    EKG:   Ordered by me  IMPRESSION AND PLAN:   1.  Hyponatremia. Likely secondary to poor appetite. Gentle IV fluid hydration. ER physician ordered a CT scan of the abdomen and pelvis. Send off urine osmolarity and urine random sodium. Nephrology consultation. Serial sodiums. TSH added onto labs. Obtain chest x-ray 2. Tightness in the abdomen and lower chest. CT scan of the abdomen ordered. Monitor on telemetry send off a few more cardiac enzymes. 3. Essential hypertension. Blood pressure high this morning. Restart her Norvasc and lisinopril 4. Hypothyroidism unspecified on levothyroxine 5. Acute respiratory failure with hypoxia. Oxygen supplementation and obtain a chest x-ray. Start nebulizer treatments. 6. Weakness secondary to hyponatremia. Physical therapy evaluation  All the records are reviewed and case discussed with ED provider. Management plans discussed with the patient,  family and they are in agreement.  CODE STATUS: DO NOT RESUSCITATE  TOTAL TIME TAKING CARE OF THIS PATIENT: 50 minutes.    Alford Highland M.D on 10/16/2016 at 8:29 AM  Between 7am to 6pm - Pager - [phone]  After 6pm call admission pager [phone]  [facility] Office  [phone]  CC: Primary care physician; Leim Fabry, MD

## 2016-10-16 NOTE — ED Notes (Signed)
Pt reports hx of hyponatremia

## 2016-10-17 DIAGNOSIS — R05 Cough: Secondary | ICD-10-CM | POA: Diagnosis not present

## 2016-10-17 DIAGNOSIS — E222 Syndrome of inappropriate secretion of antidiuretic hormone: Secondary | ICD-10-CM | POA: Diagnosis not present

## 2016-10-17 LAB — BASIC METABOLIC PANEL
ANION GAP: 6 (ref 5–15)
BUN: 17 mg/dL (ref 6–20)
CALCIUM: 8.4 mg/dL — AB (ref 8.9–10.3)
CO2: 26 mmol/L (ref 22–32)
CREATININE: 0.61 mg/dL (ref 0.44–1.00)
Chloride: 99 mmol/L — ABNORMAL LOW (ref 101–111)
GFR calc Af Amer: >60 mL/min (ref >60)
GLUCOSE: 89 mg/dL (ref 65–99)
Potassium: 4.5 mmol/L (ref 3.5–5.1)
Sodium: 131 mmol/L — ABNORMAL LOW (ref 135–145)

## 2016-10-17 LAB — CHLORIDE, URINE, RANDOM: CHLORIDE URINE: 117 mmol/L

## 2016-10-17 LAB — OSMOLALITY, URINE
OSMOLALITY UR: 397 mosm/kg (ref 300–900)
Osmolality, Ur: 217 mOsm/kg — ABNORMAL LOW (ref 300–900)

## 2016-10-17 LAB — CORTISOL: Cortisol, Plasma: 15 ug/dL

## 2016-10-17 LAB — SODIUM, URINE, RANDOM: SODIUM UR: 104 mmol/L

## 2016-10-17 LAB — OSMOLALITY: Osmolality: 274 mOsm/kg — ABNORMAL LOW (ref 275–295)

## 2016-10-17 MED ORDER — FUROSEMIDE 20 MG PO TABS
10.0000 mg | ORAL_TABLET | Freq: Every day | ORAL | 0 refills | Status: AC
Start: 2016-10-17 — End: ?

## 2016-10-17 MED ORDER — AMLODIPINE BESYLATE 5 MG PO TABS: 2.5000 mg | ORAL_TABLET | Freq: Every day | ORAL | Status: DC

## 2016-10-17 MED ORDER — LISINOPRIL 10 MG PO TABS: 10.0000 mg | ORAL_TABLET | Freq: Every day | ORAL | Status: DC

## 2016-10-17 MED ORDER — ENSURE ENLIVE PO LIQD
237.0000 mL | ORAL | 0 refills | Status: AC
Start: 2016-10-17 — End: ?

## 2016-10-17 NOTE — Care Management (Addendum)
Admitted to [facility] with the diagnosis of hyponatremia. Lives alone. Son is Laura Delacruz [phone]). Seen Dr, Dayna BarkerAldridge 04/2016 at [facility]. [facility] at [facility] [place]. [facility], was there  21 days.  Been to [facility] again 6 years ago. [facility] is in the home at present.  Will update this agency. Raised toilet seat, grab bars, rolling walker, bedside commode, walk in shower  and rollayor in the home. Talkes care of all basic and instrumental activities of daily living herself, drives. No falls, Appetite is better. Son will transport. Laura GreetBrenda S Caressa Scearce RN MSN CCM Care Management [phone]

## 2016-10-17 NOTE — Progress Notes (Signed)
Pt is being discharged today, discharge instructions reviewed with the patient. She verified understanding. 2 paper prescriptions were given to her. IV x1 removed. All belongings packed and returned to the patient. She is currently waiting on her ride, and will be rolled out in a wheelchair by staff.

## 2016-10-17 NOTE — Discharge Instructions (Signed)
Hyponatremia Hyponatremia is when the amount of salt (sodium) in your blood is too low. When salt levels are low, your cells absorb extra water and they swell. The swelling happens throughout the body, but it mostly affects the brain. Follow these instructions at home:  Take medicines only as told by your doctor. Many medicines can make this condition worse. Talk with your doctor about any medicines that you are currently taking.  Carefully follow a recommended diet as told by your doctor.  Carefully follow instructions from your doctor about fluid restrictions.  Keep all follow-up visits as told by your doctor. This is important.  Do not drink alcohol. Contact a doctor if:  You feel sicker to your stomach (nauseous).  You feel more confused.  You feel more tired (fatigued).  Your headache gets worse.  You feel weaker.  Your symptoms go away and then they come back.  You have trouble following the diet instructions. Get help right away if:  You start to twitch and shake (have a seizure).  You pass out (faint).  You keep having watery poop (diarrhea).  You keep throwing up (vomiting). This information is not intended to replace advice given to you by your health care provider. Make sure you discuss any questions you have with your health care provider. Document Released: 01/16/2011 Document Revised: 10/12/2015 Document Reviewed: 05/02/2014 Elsevier Interactive Patient Education  2018 Elsevier Inc.  

## 2016-10-17 NOTE — Progress Notes (Signed)
[facility] [place], [place] 10/17/16  Subjective:   Patient feels better today Sodium level has improved to 131 S creatinine is 0.61 Urine osmolality 397.  Objective:  Vital signs in last 24 hours:  Temp:  [98.1 F (36.7 C)-98.3 F (36.8 C)] 98.2 F (36.8 C) (05/31 1214) Pulse Rate:  [61-83] 76 (05/31 1214) Resp:  [18-20] 20 (05/31 1214) BP: (88-130)/(42-70) 124/59 (05/31 1214) SpO2:  [91 %-99 %] 96 % (05/31 1214)  Weight change: 0.227 kg (8 oz) Filed Weights   10/16/16 0442 10/16/16 1023  Weight: 57.6 kg (127 lb) 57.8 kg (127 lb 8 oz)    Intake/Output:    Intake/Output Summary (Last 24 hours) at 10/17/16 1300 Last data filed at 10/17/16 1132  Gross per 24 hour  Intake             2283 ml  Output             1150 ml  Net             1133 ml     Physical Exam: General: No acute distress, sitting up in the chair  HEENT Anicteric, moist oral mucous membranes  Neck supple  Pulm/lungs Normal effort, clear to auscultation  CVS/Heart irregular, no rub or gallop  Abdomen:  Soft, nontender, nondistended  Extremities: No edema  Neurologic: Alert, oriented  Skin: Normal turgor          Basic Metabolic Panel:   Recent Labs Lab 10/16/16 0538 10/16/16 1157 10/16/16 2016 10/17/16 0357  NA 123* 125* 128* 131*  K 4.0 4.5 4.2 4.5  CL 88* 90* 94* 99*  CO2 25 27 25 26   GLUCOSE 111* 113* 139* 89  BUN 14 10 18 17   CREATININE 0.51 0.47 0.67 0.61  CALCIUM 9.1 9.1 8.6* 8.4*     CBC:  Recent Labs Lab 10/16/16 0538  WBC 4.1  NEUTROABS 2.8  HGB 13.6  HCT 39.0  MCV 86.6  PLT 310     No results found for: HEPBSAG, HEPBSAB, HEPBIGM    Microbiology:  No results found for this or any previous visit (from the past 240 hour(s)).  Coagulation Studies: No results for input(s): LABPROT, INR in the last 72 hours.  Urinalysis: No results for input(s): COLORURINE, LABSPEC, PHURINE, GLUCOSEU, HGBUR, BILIRUBINUR, KETONESUR, PROTEINUR,  UROBILINOGEN, NITRITE, LEUKOCYTESUR in the last 72 hours.  Invalid input(s): APPERANCEUR    Imaging: Dg Chest 1 View  Result Date: 10/16/2016 CLINICAL DATA:  Nausea and vomiting over night. History of hypertension, permanent pacemaker, nonsmoker. EXAM: CHEST 1 VIEW COMPARISON:  Portable chest x-ray of June 11, 2015 FINDINGS: The lungs are well-expanded and clear. The heart is top-normal in size. The pulmonary vascularity is normal. There is calcification in the wall of the aortic arch. The ICD is in stable position. The observed bony thorax exhibits no acute abnormality. No free subdiaphragmatic gas collections are observed. IMPRESSION: There is no acute cardiopulmonary abnormality. Thoracic aortic atherosclerosis. Electronically Signed   By: David  Swazil[place]Jordan M.D.   On: 10/16/2016 09:07   Ct Abdomen Pelvis W Contrast  Result Date: 10/16/2016 CLINICAL DATA:  Acute onset nausea last night. EXAM: CT ABDOMEN AND PELVIS WITH CONTRAST TECHNIQUE: Multidetector CT imaging of the abdomen and pelvis was performed using the standard protocol following bolus administration of intravenous contrast. CONTRAST:  100 ml ISOVUE-300 IOPAMIDOL (ISOVUE-300) INJECTION 61% COMPARISON:  CT abdomen and pelvis 02/03/2014. FINDINGS: Lower chest: Lung bases are clear. No pleural or pericardial effusion. Cardiomegaly noted.  Hepatobiliary: No focal liver abnormality is seen. No gallstones, gallbladder wall thickening, or biliary dilatation. Pancreas: Unremarkable. No pancreatic ductal dilatation or surrounding inflammatory changes. Spleen: Normal in size without focal abnormality. Adrenals/Urinary Tract: Adrenal glands are unremarkable. Kidneys are normal, without renal calculi, focal lesion, or hydronephrosis. Bladder is unremarkable. Stomach/Bowel: Small hiatal hernia is seen. Stomach is otherwise within normal limits. The patient is status post appendectomy. No evidence of bowel wall thickening, distention, or inflammatory  changes. Vascular/Lymphatic: No lymphadenopathy. Aortoiliac atherosclerosis without aneurysm noted. Reproductive: Uterus and bilateral adnexa are unremarkable. Other: No fluid collection. Musculoskeletal: T12, L3 and L4 compression fractures are unchanged. The patient has a superior endplate compression fracture of L2 which is new since the prior CT scan but appears remote. Fracture fixation hardware left hip is noted. No acute bony abnormality. IMPRESSION: No acute abnormality abdomen or pelvis. Small hiatal hernia. Atherosclerosis. Cardiomegaly. L2 superior endplate compression fracture is new since 2015 but has remote appearance. Electronically Signed   By: Drusilla Kanner M.D.   On: 10/16/2016 09:59     Medications:    . amLODipine  2.5 mg Oral Daily  . aspirin EC  81 mg Oral Daily  . enoxaparin (LOVENOX) injection  40 mg Subcutaneous Q24H  . feeding supplement (ENSURE ENLIVE)  237 mL Oral Q24H  . furosemide  20 mg Oral Daily  . ipratropium-albuterol  3 mL Nebulization TID  . levothyroxine  112 mcg Oral QAC breakfast  . lisinopril  10 mg Oral Daily  . multivitamin with minerals  1 tablet Oral Daily  . potassium chloride  20 mEq Oral Daily   acetaminophen **OR** acetaminophen, ondansetron **OR** ondansetron (ZOFRAN) IV  Assessment/ Plan:  83 y.o. female with HTN, osteoporosis, hypothyroidism, SSS s/p PPM, Lhip fracture s/p IM nail in 2012, and lumbosacral spinal stenosis (s/p epidural steroid injection 2013) , was admitted on 10/16/2016 with  Nausea and hyponatremia.   1. Chronic hyponatremia with recent worsening Patient's volume status appears euvolemic.  Some possibility of volume depletion as patient had poor intake and is reporting nausea.  Sodium level has improved slightly with IV normal saline. 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  Underlying cause unknown Thyroid function is normal  Plan: Sodium has improved to 131 Continue low-dose Lasix in the morning Followup as outpatient     LOS: 1 Laura Delacruz 5/31/20181:00 PM  [facility] [address] [place], [place] [phone]

## 2016-10-17 NOTE — Discharge Summary (Signed)
[facility] - [place] [facility]   PATIENT NAME: Laura DibbleFrances Delacruz    MR#:  [id number]  DATE OF BIRTH:  06/26/1926  DATE OF ADMISSION:  10/16/2016 ADMITTING PHYSICIAN: Alford Highlandichard Maudean Hoffmann, MD  DATE OF DISCHARGE: 10/17/2016  PRIMARY CARE PHYSICIAN: Leim FabryAldridge, Barbara, MD    ADMISSION DIAGNOSIS:  Cough [R05] Hyponatremia [E87.1]  DISCHARGE DIAGNOSIS:  Active Problems:   Hyponatremia   SECONDARY DIAGNOSIS:   Past Medical History:  Diagnosis Date  . Hyperlipidemia   . Hypertension   . Hyponatremia   . Irregular heart rate   . Thyroid disease     HOSPITAL COURSE:   1.   Hyponatremia. Patient was admitted with a sodium of 123. With IV fluid hydration came up to 131. 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 Nephrology recommended Lasix 10 mg daily and outpatient follow-up. 2. Tightness in the abdomen and lower chest. CT scan of the abdomen and pelvis was negative. Cardiac enzymes were negative. 3. Relative hypotension. Blood pressure on the lower side overnight and this morning. Hold Norvasc and lisinopril at this time. Rather have the blood pressure little higher than too low. Nephrology recommended Lasix at this time. 4. Hypothyroidism unspecified on levothyroxine. TSH normal range. 5. Acute respiratory failure with hypoxia. 1 pulse ox of 86%. Patient currently breathing comfortably on room air 96% 6. Weakness. Physical therapy recommended home with home health  DISCHARGE CONDITIONS:   Satisfactory  CONSULTS OBTAINED:  Treatment Team:  Mosetta PigeonSingh, Harmeet, MD  DRUG ALLERGIES:  No Known Allergies  DISCHARGE MEDICATIONS:   Current Discharge Medication List    START taking these medications   Details  feeding supplement, ENSURE ENLIVE, (ENSURE ENLIVE) LIQD Take 237 mLs by mouth daily. Qty: 30 Bottle, Refills: 0    furosemide (LASIX) 20 MG tablet Take 0.5 tablets (10 mg total) by mouth daily. Qty: 15 tablet, Refills: 0      CONTINUE these medications which have NOT  CHANGED   Details  aspirin 81 MG tablet Take 81 mg by mouth daily.    Cholecalciferol (VITAMIN D-3) 1000 units CAPS Take by mouth.    levothyroxine (SYNTHROID, LEVOTHROID) 112 MCG tablet Take 112 mcg by mouth daily before breakfast.    potassium chloride (KLOR-CON) 20 MEQ packet Take 20 mEq by mouth daily.       STOP taking these medications     amLODipine (NORVASC) 5 MG tablet      lisinopril (PRINIVIL,ZESTRIL) 20 MG tablet          DISCHARGE INSTRUCTIONS:   Follow-up PMD one to 2 weeks Follow-up with Dr. Thedore MinsSingh nephrology one week Recommend checking sodiums serially as outpatient  If you experience worsening of your admission symptoms, develop shortness of breath, life threatening emergency, suicidal or homicidal thoughts you must seek medical attention immediately by calling 911 or calling your MD immediately  if symptoms less severe.  You Must read complete instructions/literature along with all the possible adverse reactions/side effects for all the Medicines you take and that have been prescribed to you. Take any new Medicines after you have completely understood and accept all the possible adverse reactions/side effects.   Please note  You were cared for by a hospitalist during your hospital stay. If you have any questions about your discharge medications or the care you received while you were in the hospital after you are discharged, you can call the unit and asked to speak with the hospitalist on call if the hospitalist that took care of you is not available. Once  you are discharged, your primary care physician will handle any further medical issues. Please note that NO REFILLS for any discharge medications will be authorized once you are discharged, as it is imperative that you return to your primary care physician (or establish a relationship with a primary care physician if you do not have one) for your aftercare needs so that they can reassess your need for medications  and monitor your lab values.    Today   CHIEF COMPLAINT:   Chief Complaint  Patient presents with  . Nausea    HISTORY OF PRESENT ILLNESS:  Laura Delacruz  is a 83 y.o. female presented with nausea and poor appetite and found to have a sodium that was low   VITAL SIGNS:  Blood pressure (!) 124/59, pulse 76, temperature 98.2 F (36.8 C), temperature source Oral, resp. rate 20, height 5\' 3"  (1.6 m), weight 57.8 kg (127 lb 8 oz), SpO2 96 %.  I/O:   Intake/Output Summary (Last 24 hours) at 10/17/16 1336 Last data filed at 10/17/16 1132  Gross per 24 hour  Intake             2283 ml  Output              950 ml  Net             1333 ml    PHYSICAL EXAMINATION:  GENERAL:  83 y.o.-year-old patient lying in the bed with no acute distress.  EYES: Pupils equal, round, reactive to light and accommodation. No scleral icterus. Extraocular muscles intact.  HEENT: Head atraumatic, normocephalic. Oropharynx and nasopharynx clear.  NECK:  Supple, no jugular venous distention. No thyroid enlargement, no tenderness.  LUNGS: Normal breath sounds bilaterally, no wheezing, rales,rhonchi or crepitation. No use of accessory muscles of respiration.  CARDIOVASCULAR: S1, S2 normal. No murmurs, rubs, or gallops.  ABDOMEN: Soft, non-tender, non-distended. Bowel sounds present. No organomegaly or mass.  EXTREMITIES: Trace edema. No cyanosis, or clubbing.  NEUROLOGIC: Cranial nerves II through XII are intact. Muscle strength 5/5 in all extremities. Sensation intact. Gait not checked.  PSYCHIATRIC: The patient is alert and oriented x 3.  SKIN: No obvious rash, lesion, or ulcer.   DATA REVIEW:   CBC  Recent Labs Lab 10/16/16 0538  WBC 4.1  HGB 13.6  HCT 39.0  PLT 310    Chemistries   Recent Labs Lab 10/16/16 0538  10/17/16 0357  NA 123*  < > 131*  K 4.0  < > 4.5  CL 88*  < > 99*  CO2 25  < > 26  GLUCOSE 111*  < > 89  BUN 14  < > 17  CREATININE 0.51  < > 0.61  CALCIUM 9.1  < >  8.4*  AST 19  --   --   ALT 15  --   --   ALKPHOS 119  --   --   BILITOT 0.7  --   --   < > = values in this interval not displayed.  Cardiac Enzymes  Recent Labs Lab 10/16/16 2016  TROPONINI <0.03     RADIOLOGY:  Dg Chest 1 View  Result Date: 10/16/2016 CLINICAL DATA:  Nausea and vomiting over night. History of hypertension, permanent pacemaker, nonsmoker. EXAM: CHEST 1 VIEW COMPARISON:  Portable chest x-ray of June 11, 2015 FINDINGS: The lungs are well-expanded and clear. The heart is top-normal in size. The pulmonary vascularity is normal. There is calcification in the wall of the aortic arch.  The ICD is in stable position. The observed bony thorax exhibits no acute abnormality. No free subdiaphragmatic gas collections are observed. IMPRESSION: There is no acute cardiopulmonary abnormality. Thoracic aortic atherosclerosis. Electronically Signed   By: David  [place] M.D.   On: 10/16/2016 09:07   Ct Abdomen Pelvis W Contrast  Result Date: 10/16/2016 CLINICAL DATA:  Acute onset nausea last night. EXAM: CT ABDOMEN AND PELVIS WITH CONTRAST TECHNIQUE: Multidetector CT imaging of the abdomen and pelvis was performed using the standard protocol following bolus administration of intravenous contrast. CONTRAST:  100 ml ISOVUE-300 IOPAMIDOL (ISOVUE-300) INJECTION 61% COMPARISON:  CT abdomen and pelvis 02/03/2014. FINDINGS: Lower chest: Lung bases are clear. No pleural or pericardial effusion. Cardiomegaly noted. Hepatobiliary: No focal liver abnormality is seen. No gallstones, gallbladder wall thickening, or biliary dilatation. Pancreas: Unremarkable. No pancreatic ductal dilatation or surrounding inflammatory changes. Spleen: Normal in size without focal abnormality. Adrenals/Urinary Tract: Adrenal glands are unremarkable. Kidneys are normal, without renal calculi, focal lesion, or hydronephrosis. Bladder is unremarkable. Stomach/Bowel: Small hiatal hernia is seen. Stomach is otherwise within  normal limits. The patient is status post appendectomy. No evidence of bowel wall thickening, distention, or inflammatory changes. Vascular/Lymphatic: No lymphadenopathy. Aortoiliac atherosclerosis without aneurysm noted. Reproductive: Uterus and bilateral adnexa are unremarkable. Other: No fluid collection. Musculoskeletal: T12, L3 and L4 compression fractures are unchanged. The patient has a superior endplate compression fracture of L2 which is new since the prior CT scan but appears remote. Fracture fixation hardware left hip is noted. No acute bony abnormality. IMPRESSION: No acute abnormality abdomen or pelvis. Small hiatal hernia. Atherosclerosis. Cardiomegaly. L2 superior endplate compression fracture is new since 2015 but has remote appearance. Electronically Signed   By: Drusilla Kanner M.D.   On: 10/16/2016 09:59    Management plans discussed with the patient, family and they are in agreement.  CODE STATUS:     Code Status Orders        Start     Ordered   10/16/16 0821  Do not attempt resuscitation (DNR)  Continuous    Question Answer Comment  In the event of cardiac or respiratory ARREST Do not call a "code blue"   In the event of cardiac or respiratory ARREST Do not perform Intubation, CPR, defibrillation or ACLS   In the event of cardiac or respiratory ARREST Use medication by any route, position, wound care, and other measures to relive pain and suffering. May use oxygen, suction and manual treatment of airway obstruction as needed for comfort.   Comments nurse may pronounce      10/16/16 0821    Code Status History    Date Active Date Inactive Code Status Order ID Comments User Context   This patient has a current code status but no historical code status.    Advance Directive Documentation     Most Recent Value  Type of Advance Directive  Living will, Healthcare Power of Attorney  Pre-existing out of facility DNR order (yellow form or pink MOST form)  -  "MOST" Form in  Place?  -      TOTAL TIME TAKING CARE OF THIS PATIENT: 35 minutes.    Alford Highland M.D on 10/17/2016 at 1:36 PM  Between 7am to 6pm - Pager - [phone]  After 6pm go to [email or web address] - password [email or web address]  [facility] Office  [phone]  CC: Primary care physician; Leim Fabry, MD

## 2016-10-18 LAB — PROTEIN ELECTROPHORESIS, SERUM
A/G Ratio: 1.2 (ref 0.7–1.7)
ALBUMIN ELP: 3.1 g/dL (ref 2.9–4.4)
ALPHA-1-GLOBULIN: 0.3 g/dL (ref 0.0–0.4)
Alpha-2-Globulin: 0.7 g/dL (ref 0.4–1.0)
Beta Globulin: 0.9 g/dL (ref 0.7–1.3)
GAMMA GLOBULIN: 0.7 g/dL (ref 0.4–1.8)
GLOBULIN, TOTAL: 2.6 g/dL (ref 2.2–3.9)
TOTAL PROTEIN ELP: 5.7 g/dL — AB (ref 6.0–8.5)

## 2016-10-18 LAB — PROTEIN ELECTRO, RANDOM URINE
Albumin ELP, Urine: 0 %
Alpha-1-Globulin, U: 0 %
Alpha-2-Globulin, U: 0 %
BETA GLOBULIN, U: 0 %
GAMMA GLOBULIN, U: 0 %
Total Protein, Urine: 13.3 mg/dL

## 2016-10-18 LAB — KAPPA/LAMBDA LIGHT CHAINS
Kappa free light chain: 21.8 mg/L — ABNORMAL HIGH (ref 3.3–19.4)
Kappa, lambda light chain ratio: 1 (ref 0.26–1.65)
LAMDA FREE LIGHT CHAINS: 21.8 mg/L (ref 5.7–26.3)

## 2016-12-09 ENCOUNTER — Other Ambulatory Visit: Payer: Self-pay | Admitting: Family Medicine

## 2016-12-09 DIAGNOSIS — IMO0001 Reserved for inherently not codable concepts without codable children: Secondary | ICD-10-CM

## 2016-12-09 DIAGNOSIS — M4850XS Collapsed vertebra, not elsewhere classified, site unspecified, sequela of fracture: Secondary | ICD-10-CM

## 2016-12-09 DIAGNOSIS — M8008XA Age-related osteoporosis with current pathological fracture, vertebra(e), initial encounter for fracture: Secondary | ICD-10-CM

## 2016-12-09 NOTE — Progress Notes (Signed)
New PT noted to include G codes.    10/16/16 1423  PT G-Codes **NOT FOR INPATIENT CLASS**  Functional Assessment Tool Used AM-PAC 6 Clicks Basic Mobility;Clinical judgement  Functional Limitation Mobility: Walking and moving around  Mobility: Walking and Moving Around Current Status (Z6109(G8978) CJ  Mobility: Walking and Moving Around Goal Status (U0454(G8979) CI   Encarnacion ChuAshley Harnoor Reta PT, DPT

## 2017-05-15 ENCOUNTER — Emergency Department: Payer: Medicare Other

## 2017-05-15 ENCOUNTER — Encounter: Payer: Self-pay | Admitting: Emergency Medicine

## 2017-05-15 ENCOUNTER — Emergency Department
Admission: EM | Admit: 2017-05-15 | Discharge: 2017-05-15 | Disposition: A | Discharge status: Home / Self Care | Payer: Medicare Other | Attending: Student in an Organized Health Care Education/Training Program | Admitting: Student in an Organized Health Care Education/Training Program

## 2017-05-15 DIAGNOSIS — I10 Essential (primary) hypertension: Secondary | ICD-10-CM

## 2017-05-15 DIAGNOSIS — Z79899 Other long term (current) drug therapy: Secondary | ICD-10-CM | POA: Insufficient documentation

## 2017-05-15 DIAGNOSIS — Z7982 Long term (current) use of aspirin: Secondary | ICD-10-CM | POA: Insufficient documentation

## 2017-05-15 DIAGNOSIS — Z950 Presence of cardiac pacemaker: Secondary | ICD-10-CM | POA: Insufficient documentation

## 2017-05-15 LAB — TROPONIN I: Troponin I: <0.03 ng/mL (ref <0.03)

## 2017-05-15 LAB — BASIC METABOLIC PANEL
ANION GAP: 8 (ref 5–15)
BUN: 30 mg/dL — AB (ref 6–20)
CHLORIDE: 97 mmol/L — AB (ref 101–111)
CO2: 27 mmol/L (ref 22–32)
Calcium: 9.2 mg/dL (ref 8.9–10.3)
Creatinine, Ser: 0.83 mg/dL (ref 0.44–1.00)
Glucose, Bld: 144 mg/dL — ABNORMAL HIGH (ref 65–99)
POTASSIUM: 4.1 mmol/L (ref 3.5–5.1)
SODIUM: 132 mmol/L — AB (ref 135–145)

## 2017-05-15 LAB — CBC
HEMATOCRIT: 38.7 % (ref 35.0–47.0)
HEMOGLOBIN: 13.1 g/dL (ref 12.0–16.0)
MCH: 29.6 pg (ref 26.0–34.0)
MCHC: 33.9 g/dL (ref 32.0–36.0)
MCV: 87.3 fL (ref 80.0–100.0)
Platelets: 295 10*3/uL (ref 150–440)
RBC: 4.43 MIL/uL (ref 3.80–5.20)
RDW: 13.8 % (ref 11.5–14.5)
WBC: 4.5 10*3/uL (ref 3.6–11.0)

## 2017-05-15 NOTE — ED Notes (Signed)
Pt states that her BP has been really high today (over 200). Reports a flushed face. Family at bedside

## 2017-05-15 NOTE — Discharge Instructions (Signed)
I would recommend taking 2 doses of your normal lisinopril in the morning with follow-up with PCP tomorrow.  Return for any worsening shortness of breath, chest pain, blurry vision or for any other additional questions or concerns.

## 2017-05-15 NOTE — ED Provider Notes (Signed)
[facility] Emergency Department Provider Note    First MD Initiated Contact with Patient 05/15/17 2033     (approximate)  I have reviewed the triage vital signs and the nursing notes.   HISTORY  Chief Complaint Hypertension    HPI Laura Delacruz is a 83 y.o. female with a history of hypertension presents to the ER with chief complaint of elevated blood pressure since early this morning.  Patient with no recent hospitalizations.  Does have a history of heart disease status post pacemaker.  States that she is on 10 mg lisinopril in the morning as well as 5 mg of Norvasc at night.  States that she took her blood pressure earlier this morning to test out a new blood pressure cuff and it was elevated to 204/96.  States that she was feeling flushed in the face and was very concerned about this elevated blood pressure so she checked it 6 more times and it remained elevated.  She did take her Norvasc this evening hoping that it would come down but when she rechecked it shortly thereafter was only 180 systolic.  She called the nursing line at her PCPs office and they directed her to the ER for further evaluation.  Patient denies any other complaints at this time.  No blurry vision.  No chest pain.  No orthopnea.  No lower extremity swelling.  Past Medical History:  Diagnosis Date  . Hyperlipidemia   . Hypertension   . Hyponatremia   . Irregular heart rate   . Thyroid disease    Family History  Problem Relation Age of Onset  . AAA (abdominal aortic aneurysm) Mother    Past Surgical History:  Procedure Laterality Date  . APPENDECTOMY    . CARDIAC SURGERY    . PACEMAKER INSERTION Left 2016   Patient Active Problem List   Diagnosis Date Noted  . Hyponatremia 10/16/2016      Prior to Admission medications   Medication Sig Start Date End Date Taking? Authorizing Provider  aspirin 81 MG tablet Take 81 mg by mouth daily.    [provider]    Cholecalciferol (VITAMIN D-3) 1000 units CAPS Take by mouth.    [provider]  feeding supplement, ENSURE ENLIVE, (ENSURE ENLIVE) LIQD Take 237 mLs by mouth daily. 10/17/16   Alford HighlandWieting, Richard, MD  furosemide (LASIX) 20 MG tablet Take 0.5 tablets (10 mg total) by mouth daily. 10/17/16   Alford HighlandWieting, Richard, MD  levothyroxine (SYNTHROID, LEVOTHROID) 112 MCG tablet Take 112 mcg by mouth daily before breakfast.    [provider]  potassium chloride (KLOR-CON) 20 MEQ packet Take 20 mEq by mouth daily.     [provider]    Allergies Patient has no known allergies.    Social History Social History   Tobacco Use  . Smoking status: Never Smoker  . Smokeless tobacco: Never Used  Substance Use Topics  . Alcohol use: No  . Drug use: Not on file    Review of Systems Patient denies headaches, rhinorrhea, blurry vision, numbness, shortness of breath, chest pain, edema, cough, abdominal pain, nausea, vomiting, diarrhea, dysuria, fevers, rashes or hallucinations unless otherwise stated above in HPI. ____________________________________________   PHYSICAL EXAM:  VITAL SIGNS: Vitals:   05/15/17 1909 05/15/17 2040  BP: 132/86 124/67  Pulse: 99 73  Resp: 16 17  Temp: 98.2 F (36.8 C)   SpO2: 98% 100%    Constitutional: Alert and oriented. Well appearing and in no acute  distress. Eyes: Conjunctivae are normal.  Head: Atraumatic. Nose: No congestion/rhinnorhea. Mouth/Throat: Mucous membranes are moist.   Neck: No stridor. Painless ROM.  Cardiovascular: Normal rate, regular rhythm. Grossly normal heart sounds.  Good peripheral circulation. Respiratory: Normal respiratory effort.  No retractions. Lungs CTAB. Gastrointestinal: Soft and nontender. No distention. No abdominal bruits. No CVA tenderness. Genitourinary:  Musculoskeletal: No lower extremity tenderness nor edema.  No joint effusions. Neurologic:  Normal speech and language. No gross focal neurologic  deficits are appreciated. No facial droop Skin:  Skin is warm, dry and intact. No rash noted. Psychiatric: Mood and affect are normal. Speech and behavior are normal.  ____________________________________________   LABS (all labs ordered are listed, but only abnormal results are displayed)  Results for orders placed or performed during the hospital encounter of 05/15/17 (from the past 24 hour(s))  Basic metabolic panel     Status: Abnormal   Collection Time: 05/15/17  7:10 PM  Result Value Ref Range   Sodium 132 (L) 135 - 145 mmol/L   Potassium 4.1 3.5 - 5.1 mmol/L   Chloride 97 (L) 101 - 111 mmol/L   CO2 27 22 - 32 mmol/L   Glucose, Bld 144 (H) 65 - 99 mg/dL   BUN 30 (H) 6 - 20 mg/dL   Creatinine, Ser 4.090.83 0.44 - 1.00 mg/dL   Calcium 9.2 8.9 - 81.110.3 mg/dL   GFR calc non Af Amer >60 >60 mL/min   GFR calc Af Amer >60 >60 mL/min   Anion gap 8 5 - 15  CBC     Status: None   Collection Time: 05/15/17  7:10 PM  Result Value Ref Range   WBC 4.5 3.6 - 11.0 K/uL   RBC 4.43 3.80 - 5.20 MIL/uL   Hemoglobin 13.1 12.0 - 16.0 g/dL   HCT 91.438.7 78.235.0 - 95.647.0 %   MCV 87.3 80.0 - 100.0 fL   MCH 29.6 26.0 - 34.0 pg   MCHC 33.9 32.0 - 36.0 g/dL   RDW 21.313.8 08.611.5 - 57.814.5 %   Platelets 295 150 - 440 K/uL  Troponin I     Status: None   Collection Time: 05/15/17  7:10 PM  Result Value Ref Range   Troponin I <0.03 <0.03 ng/mL   ____________________________________________  EKG My review and personal interpretation at Time: 19:11   Indication: htn  Rate: 90  Rhythm: asensed v-paced Axis: left Other: Appears to be a normal paced rhythm, no scar Bosa criteria, otherwise normal intervals.  No evidence of ST elevation MI. ____________________________________________  RADIOLOGY   ____________________________________________   PROCEDURES  Procedure(s) performed:  Procedures    Critical Care performed: no ____________________________________________   INITIAL IMPRESSION / ASSESSMENT AND  PLAN / ED COURSE  Pertinent labs & imaging results that were available during my care of the patient were reviewed by me and considered in my medical decision making (see chart for details).  DDX: htn, noncompliance, chf, cva  Laura BillFrances Marie Orbach is a 83 y.o. who presents to the ED with Non-distressed patient presenting with concern for elevated BP. Patient is AF,VSS with HTN in ED. Exam as above. Given current presentation have considered the above differential. No report of missed antihypertensive doses or medical non-compliance. No report of other drug use that could elevate BP.  Extensive evaluation of possible end organ damage pursued in ED. No evidence of acute renal dysfunction. Neuro exam with no focal deficits. EKG without evidence of ischemia. Trop negative. Not consistent with CHF, malignant htn, adrenergic  crisis or hypertensive emergency.   Will have patient follow up with PCP for BP recheck. Discussed strict return parameters.       ____________________________________________   FINAL CLINICAL IMPRESSION(S) / ED DIAGNOSES  Final diagnoses:  Hypertension, unspecified type      NEW MEDICATIONS STARTED DURING THIS VISIT:  This SmartLink is deprecated. Use AVSMEDLIST instead to display the medication list for a patient.   Note:  This document was prepared using Dragon voice recognition software and may include unintentional dictation errors.    Willy Eddy, MD 05/15/17 2101

## 2017-05-15 NOTE — ED Notes (Signed)
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

## 2017-05-15 NOTE — ED Triage Notes (Signed)
Pt reports she has had central chest heaviness, flushed feeling to face and when pt took BP at home reading was 204/96. Pt to ED due to high BP. Pt denies SOB and A&O x4.

## 2019-09-07 ENCOUNTER — Ambulatory Visit
Admission: RE | Admit: 2019-09-07 | Discharge: 2019-09-07 | Disposition: A | Discharge status: Home / Self Care | Payer: Medicare Other | Attending: Family Medicine | Admitting: Family Medicine

## 2019-09-07 ENCOUNTER — Other Ambulatory Visit: Payer: Self-pay | Admitting: Family Medicine

## 2019-09-07 ENCOUNTER — Other Ambulatory Visit: Payer: Self-pay

## 2019-09-07 ENCOUNTER — Ambulatory Visit
Admission: RE | Admit: 2019-09-07 | Discharge: 2019-09-07 | Disposition: A | Discharge status: Home / Self Care | Payer: Medicare Other | Source: Ambulatory Visit | Attending: Family Medicine | Admitting: Family Medicine

## 2019-09-07 DIAGNOSIS — M545 Low back pain, unspecified: Secondary | ICD-10-CM

## 2019-09-07 DIAGNOSIS — G8929 Other chronic pain: Secondary | ICD-10-CM

## 2019-12-06 ENCOUNTER — Emergency Department: Payer: Medicare Other

## 2019-12-06 ENCOUNTER — Emergency Department
Admission: EM | Admit: 2019-12-06 | Discharge: 2019-12-06 | Disposition: A | Discharge status: Home / Self Care | Payer: Medicare Other | Attending: Emergency Medicine | Admitting: Emergency Medicine

## 2019-12-06 ENCOUNTER — Other Ambulatory Visit: Payer: Self-pay

## 2019-12-06 DIAGNOSIS — I10 Essential (primary) hypertension: Secondary | ICD-10-CM | POA: Insufficient documentation

## 2019-12-06 DIAGNOSIS — Z950 Presence of cardiac pacemaker: Secondary | ICD-10-CM | POA: Insufficient documentation

## 2019-12-06 DIAGNOSIS — Z79899 Other long term (current) drug therapy: Secondary | ICD-10-CM | POA: Insufficient documentation

## 2019-12-06 DIAGNOSIS — B029 Zoster without complications: Secondary | ICD-10-CM | POA: Diagnosis not present

## 2019-12-06 DIAGNOSIS — Z7982 Long term (current) use of aspirin: Secondary | ICD-10-CM | POA: Diagnosis not present

## 2019-12-06 DIAGNOSIS — R0789 Other chest pain: Secondary | ICD-10-CM | POA: Diagnosis present

## 2019-12-06 DIAGNOSIS — R0602 Shortness of breath: Secondary | ICD-10-CM | POA: Insufficient documentation

## 2019-12-06 LAB — BASIC METABOLIC PANEL
Anion gap: 7 (ref 5–15)
BUN: 24 mg/dL — ABNORMAL HIGH (ref 8–23)
CO2: 28 mmol/L (ref 22–32)
Calcium: 9.2 mg/dL (ref 8.9–10.3)
Chloride: 99 mmol/L (ref 98–111)
Creatinine, Ser: 0.9 mg/dL (ref 0.44–1.00)
GFR calc Af Amer: >60 mL/min (ref >60)
GFR calc non Af Amer: 56 mL/min — ABNORMAL LOW (ref >60)
Glucose, Bld: 127 mg/dL — ABNORMAL HIGH (ref 70–99)
Potassium: 4 mmol/L (ref 3.5–5.1)
Sodium: 134 mmol/L — ABNORMAL LOW (ref 135–145)

## 2019-12-06 LAB — CBC
HCT: 38.5 % (ref 36.0–46.0)
Hemoglobin: 13.4 g/dL (ref 12.0–15.0)
MCH: 30 pg (ref 26.0–34.0)
MCHC: 34.8 g/dL (ref 30.0–36.0)
MCV: 86.3 fL (ref 80.0–100.0)
Platelets: 226 10*3/uL (ref 150–400)
RBC: 4.46 MIL/uL (ref 3.87–5.11)
RDW: 13.9 % (ref 11.5–15.5)
WBC: 4 10*3/uL (ref 4.0–10.5)
nRBC: 0 % (ref 0.0–0.2)

## 2019-12-06 LAB — HEPATIC FUNCTION PANEL
ALT: 13 U/L (ref 0–44)
AST: 18 U/L (ref 15–41)
Albumin: 4.2 g/dL (ref 3.5–5.0)
Alkaline Phosphatase: 62 U/L (ref 38–126)
Bilirubin, Direct: <0.1 mg/dL (ref 0.0–0.2)
Total Bilirubin: 0.6 mg/dL (ref 0.3–1.2)
Total Protein: 7.3 g/dL (ref 6.5–8.1)

## 2019-12-06 LAB — TROPONIN I (HIGH SENSITIVITY)
Troponin I (High Sensitivity): 17 ng/L (ref <18)
Troponin I (High Sensitivity): 17 ng/L (ref <18)

## 2019-12-06 LAB — LIPASE, BLOOD: Lipase: 33 U/L (ref 11–51)

## 2019-12-06 MED ORDER — VALACYCLOVIR HCL 1 G PO TABS
1000.0000 mg | ORAL_TABLET | Freq: Three times a day (TID) | ORAL | 0 refills | Status: AC
Start: 2019-12-06 — End: 2019-12-13

## 2019-12-06 MED ORDER — SODIUM CHLORIDE 0.9% FLUSH: 3.0000 mL | Freq: Once | INTRAVENOUS | Status: DC

## 2019-12-06 NOTE — ED Triage Notes (Addendum)
Pt comes via POV from home with c/o CP that started last Saturday. Pt states it went away but today she started to have some SOB. Pt also states pain in left side of neck and jaw.  Pt states pain to left side from where pacemaker is. Pt states it just some little soreness.

## 2019-12-06 NOTE — Discharge Instructions (Signed)
We suspect that your chest pain is related to shingles, which is caused by a virus (the same virus that causes chickenpox).  Take the Valtrex as prescribed for the next week.  Follow-up with your regular doctor within the next 1 to 2 weeks.  Return to the ER immediately for new, worsening, or persistent chest pain, worsening rash, difficulty breathing, weakness or lightheadedness, or any other new or worsening symptoms that concern you.

## 2019-12-06 NOTE — ED Provider Notes (Signed)
[facility] Emergency Department Provider Note ____________________________________________   First MD Initiated Contact with Patient 12/06/19 1722     (approximate)  I have reviewed the triage vital signs and the nursing notes.   HISTORY  Chief Complaint Chest Pain and Shortness of Breath    HPI Laura Delacruz is a 86 y.o. female with PMH as noted below who presents with left-sided chest pain, initially starting 2 days ago with pain in the left side of her chest, neck, and up to her jaw.  She states that the pain subsided, but then today came back more in the left chest around the area of her pacemaker.  She states that it feels like a soreness.  She also reports a rash there that appeared sometime over the last couple of days.  She has had mild shortness of breath today, but states that she is anxious about having had to wait for hours to be seen in the ER.  She denies any acute cough, fever or chills, vomiting or diarrhea, or other abnormal rashes recently.  Past Medical History:  Diagnosis Date  . Hyperlipidemia   . Hypertension   . Hyponatremia   . Irregular heart rate   . Thyroid disease     Patient Active Problem List   Diagnosis Date Noted  . Hyponatremia 10/16/2016    Past Surgical History:  Procedure Laterality Date  . APPENDECTOMY    . CARDIAC SURGERY    . PACEMAKER INSERTION Left 2016    Prior to Admission medications   Medication Sig Start Date End Date Taking? Authorizing Provider  aspirin 81 MG tablet Take 81 mg by mouth daily.    [provider]  Cholecalciferol (VITAMIN D-3) 1000 units CAPS Take by mouth.    [provider]  feeding supplement, ENSURE ENLIVE, (ENSURE ENLIVE) LIQD Take 237 mLs by mouth daily. 10/17/16   Alford Highland, MD  furosemide (LASIX) 20 MG tablet Take 0.5 tablets (10 mg total) by mouth daily. 10/17/16   Alford Highland, MD  levothyroxine (SYNTHROID, LEVOTHROID) 112 MCG tablet  Take 112 mcg by mouth daily before breakfast.    [provider]  potassium chloride (KLOR-CON) 20 MEQ packet Take 20 mEq by mouth daily.     [provider]  valACYclovir (VALTREX) 1000 MG tablet Take 1 tablet (1,000 mg total) by mouth 3 (three) times daily for 7 days. 12/06/19 12/13/19  Dionne Bucy, MD    Allergies Patient has no known allergies.  Family History  Problem Relation Age of Onset  . AAA (abdominal aortic aneurysm) Mother     Social History Social History   Tobacco Use  . Smoking status: Never Smoker  . Smokeless tobacco: Never Used  Substance Use Topics  . Alcohol use: No  . Drug use: Not on file    Review of Systems  Constitutional: No fever/chills. Eyes: No redness ENT: No sore throat. Cardiovascular: Positive for chest pain. Respiratory: Positive for mild shortness of breath. Gastrointestinal: No vomiting or diarrhea.  Genitourinary: Negative for flank pain.  Musculoskeletal: Negative for back pain. Skin: Negative for rash. Neurological: Negative for headache.   ____________________________________________   PHYSICAL EXAM:  VITAL SIGNS: ED Triage Vitals  Enc Vitals Group     BP 12/06/19 1331 125/68     Pulse Rate 12/06/19 1331 82     Resp 12/06/19 1331 18     Temp 12/06/19 1331 (!) 97.5 F (36.4 C)     Temp src --  SpO2 12/06/19 1331 97 %     Weight 12/06/19 1332 127 lb (57.6 kg)     Height 12/06/19 1332 5\' 2"  (1.575 m)     Head Circumference --      Peak Flow --      Pain Score 12/06/19 1332 0     Pain Loc --      Pain Edu? --      Excl. in GC? --     Constitutional: Alert and oriented. Well appearing and in no acute distress. Eyes: Conjunctivae are normal.  Head: Atraumatic. Nose: No congestion/rhinnorhea. Mouth/Throat: Mucous membranes are moist.   Neck: Normal range of motion.  Cardiovascular: Normal rate, regular rhythm. Grossly normal heart sounds.  Good peripheral circulation. Respiratory:  Normal respiratory effort.  No retractions. Lungs CTAB. Gastrointestinal:  No distention.  Musculoskeletal: Extremities warm and well perfused.  Neurologic:  Normal speech and language. No gross focal neurologic deficits are appreciated.  Skin:  Skin is warm and dry.  Patchy erythematous rash with some crusting lesions along the left upper chest wall towards the trapezius area, following the C4 dermatome  Psychiatric: Mood and affect are normal. Speech and behavior are normal.  ____________________________________________   LABS (all labs ordered are listed, but only abnormal results are displayed)  Labs Reviewed  BASIC METABOLIC PANEL - Abnormal; Notable for the following components:      Result Value   Sodium 134 (*)    Glucose, Bld 127 (*)    BUN 24 (*)    GFR calc non Af Amer 56 (*)    All other components within normal limits  CBC  HEPATIC FUNCTION PANEL  LIPASE, BLOOD  TROPONIN I (HIGH SENSITIVITY)  TROPONIN I (HIGH SENSITIVITY)   ____________________________________________  EKG  ED ECG REPORT I, 12/08/19, the attending physician, personally viewed and interpreted this ECG.  Date: 12/06/2019 EKG Time: 1331 Rate: 80 Rhythm: Paced rhythm Intervals: normal ST/T Wave abnormalities: normal Narrative Interpretation: no evidence of acute ischemia  ____________________________________________  RADIOLOGY  CXR: Bronchitic changes with no focal infiltrate or edema  ____________________________________________   PROCEDURES  Procedure(s) performed: No  Procedures  Critical Care performed: No ____________________________________________   INITIAL IMPRESSION / ASSESSMENT AND PLAN / ED COURSE  Pertinent labs & imaging results that were available during my care of the patient were reviewed by me and considered in my medical decision making (see chart for details).  86 year old female with PMH as noted above including a pacemaker presents with atypical  left-sided chest pain which started 2 days ago, subsided somewhat and then returned today along with mild shortness of breath which she attributes to anxiety.  She also developed a rash sometime over the last 2 days in the same area.  I reviewed the past medical records in Epic.  The patient has not been seen in the ED here since 2018.  She has no history of CAD.  On exam she is very well-appearing for her age and her vital signs are normal.  Exam is mainly consistent for a rash with some areas of crusting following the left C4 dermatome, consistent with zoster.  Overall presentation is consistent with shingles.  The patient had waited 4 hours before my initial evaluation, and during this time chest x-ray, basic labs and troponins x2 were completed and are all within normal limits.  There is no evidence of cardiac etiology.  I also do not suspect PE or vascular etiology given the normal vital signs and intermittent nature of  the pain, and especially the presence of the rash and the fact that the pain has been localized mainly to this area.  The patient has no active chest pain at this time except of the rash.  Since the symptoms started within the last 3 days, I think treatment with Valtrex will be reasonable.  The patient does not require any additional pain medication.  She is stable for discharge at this time.  I counseled her on the results of the work-up and the plan of care.  I also called her son and spoke to him about the results of the work-up and the plan of care.  Return precautions provided, and they both expressed understanding.  ____________________________________________   FINAL CLINICAL IMPRESSION(S) / ED DIAGNOSES  Final diagnoses:  Herpes zoster without complication  Atypical chest pain      NEW MEDICATIONS STARTED DURING THIS VISIT:  New Prescriptions   VALACYCLOVIR (VALTREX) 1000 MG TABLET    Take 1 tablet (1,000 mg total) by mouth 3 (three) times daily for 7 days.      Note:  This document was prepared using Dragon voice recognition software and may include unintentional dictation errors.   Dionne Bucy, MD 12/06/19 [phone]

## 2020-11-21 ENCOUNTER — Other Ambulatory Visit: Payer: Self-pay

## 2020-11-21 ENCOUNTER — Ambulatory Visit
Admission: EM | Admit: 2020-11-21 | Discharge: 2020-11-21 | Disposition: A | Discharge status: Home / Self Care | Payer: Medicare Other | Attending: Family Medicine | Admitting: Family Medicine

## 2020-11-21 DIAGNOSIS — E871 Hypo-osmolality and hyponatremia: Secondary | ICD-10-CM | POA: Diagnosis present

## 2020-11-21 DIAGNOSIS — R5383 Other fatigue: Secondary | ICD-10-CM | POA: Diagnosis present

## 2020-11-21 LAB — CBC WITH DIFFERENTIAL/PLATELET
Abs Immature Granulocytes: 0.02 10*3/uL (ref 0.00–0.07)
Basophils Absolute: 0 10*3/uL (ref 0.0–0.1)
Basophils Relative: 1 %
Eosinophils Absolute: 0 10*3/uL (ref 0.0–0.5)
Eosinophils Relative: 0 %
HCT: 40.1 % (ref 36.0–46.0)
Hemoglobin: 13.9 g/dL (ref 12.0–15.0)
Immature Granulocytes: 0 %
Lymphocytes Relative: 15 %
Lymphs Abs: 0.8 10*3/uL (ref 0.7–4.0)
MCH: 29 pg (ref 26.0–34.0)
MCHC: 34.7 g/dL (ref 30.0–36.0)
MCV: 83.7 fL (ref 80.0–100.0)
Monocytes Absolute: 0.7 10*3/uL (ref 0.1–1.0)
Monocytes Relative: 14 %
Neutro Abs: 3.6 10*3/uL (ref 1.7–7.7)
Neutrophils Relative %: 70 %
Platelets: 283 10*3/uL (ref 150–400)
RBC: 4.79 MIL/uL (ref 3.87–5.11)
RDW: 13.5 % (ref 11.5–15.5)
WBC: 5.2 10*3/uL (ref 4.0–10.5)
nRBC: 0 % (ref 0.0–0.2)

## 2020-11-21 LAB — URINALYSIS, COMPLETE (UACMP) WITH MICROSCOPIC
Bilirubin Urine: NEGATIVE
Glucose, UA: NEGATIVE mg/dL
Ketones, ur: NEGATIVE mg/dL
Leukocytes,Ua: NEGATIVE
Nitrite: NEGATIVE
Protein, ur: NEGATIVE mg/dL
Specific Gravity, Urine: 1.02 (ref 1.005–1.030)
pH: 7 (ref 5.0–8.0)

## 2020-11-21 LAB — COMPREHENSIVE METABOLIC PANEL
ALT: 13 U/L (ref 0–44)
AST: 17 U/L (ref 15–41)
Albumin: 4.4 g/dL (ref 3.5–5.0)
Alkaline Phosphatase: 69 U/L (ref 38–126)
Anion gap: 9 (ref 5–15)
BUN: 21 mg/dL (ref 8–23)
CO2: 27 mmol/L (ref 22–32)
Calcium: 9.1 mg/dL (ref 8.9–10.3)
Chloride: 95 mmol/L — ABNORMAL LOW (ref 98–111)
Creatinine, Ser: 0.73 mg/dL (ref 0.44–1.00)
GFR, Estimated: >60 mL/min (ref >60)
Glucose, Bld: 95 mg/dL (ref 70–99)
Potassium: 4.1 mmol/L (ref 3.5–5.1)
Sodium: 131 mmol/L — ABNORMAL LOW (ref 135–145)
Total Bilirubin: 0.6 mg/dL (ref 0.3–1.2)
Total Protein: 7.8 g/dL (ref 6.5–8.1)

## 2020-11-21 LAB — TROPONIN I (HIGH SENSITIVITY): Troponin I (High Sensitivity): 17 ng/L (ref <18)

## 2020-11-21 NOTE — ED Provider Notes (Signed)
[facility]    CSN: [id number] Arrival date & time: 11/21/20  1001      History   Chief Complaint Chief Complaint  Patient presents with   Hematuria    HPI  87 year old female presents with the above complaint.  Patient reports that she has had generalized weakness for the past 3 days.  She states that she feels "weak".  When asked to further explain this, she states that she feels like at times that she gets weak and has to sit down.  She denies syncope.  She states that she is having some low back pain.  Patient also reports that today she used the restroom and noted blood when she wiped.  No dysuria.  No relieving factors.  No other complaints.  Past Medical History:  Diagnosis Date   Hyperlipidemia    Hypertension    Hyponatremia    Irregular heart rate    Thyroid disease     Patient Active Problem List   Diagnosis Date Noted   Hyponatremia 10/16/2016    Past Surgical History:  Procedure Laterality Date   APPENDECTOMY     CARDIAC SURGERY     PACEMAKER INSERTION Left 2016    OB History   No obstetric history on file.      Home Medications    Prior to Admission medications   Medication Sig Start Date End Date Taking? Authorizing Provider  aspirin 81 MG tablet Take 81 mg by mouth daily.    [provider]  Cholecalciferol (VITAMIN D-3) 1000 units CAPS Take by mouth.    [provider]  feeding supplement, ENSURE ENLIVE, (ENSURE ENLIVE) LIQD Take 237 mLs by mouth daily. 10/17/16   Alford Highland, MD  furosemide (LASIX) 20 MG tablet Take 0.5 tablets (10 mg total) by mouth daily. 10/17/16   Alford Highland, MD  levothyroxine (SYNTHROID, LEVOTHROID) 112 MCG tablet Take 112 mcg by mouth daily before breakfast.    [provider]  potassium chloride (KLOR-CON) 20 MEQ packet Take 20 mEq by mouth daily.     [provider]    Family History Family History  Problem Relation Age of Onset   AAA (abdominal aortic  aneurysm) Mother     Social History Social History   Tobacco Use   Smoking status: Never   Smokeless tobacco: Never  Substance Use Topics   Alcohol use: No     Allergies   Patient has no known allergies.   Review of Systems Review of Systems Per HPI  Physical Exam Triage Vital Signs ED Triage Vitals  Enc Vitals Group     BP 11/21/20 1109 140/68     Pulse Rate 11/21/20 1109 84     Resp 11/21/20 1109 19     Temp 11/21/20 1109 98 F (36.7 C)     Temp src --      SpO2 11/21/20 1109 99 %     Weight --      Height --      Head Circumference --      Peak Flow --      Pain Score 11/21/20 1107 0     Pain Loc --      Pain Edu? --      Excl. in GC? --    Updated Vital Signs BP 140/68   Pulse 84   Temp 98 F (36.7 C)   Resp 19   SpO2 99%   Visual Acuity Right Eye Distance:   Left Eye Distance:  Bilateral Distance:    Right Eye Near:   Left Eye Near:    Bilateral Near:     Physical Exam Vitals and nursing note reviewed.  Constitutional:      General: She is not in acute distress.    Appearance: Normal appearance. She is not ill-appearing.  HENT:     Head: Normocephalic and atraumatic.  Eyes:     General:        Right eye: No discharge.        Left eye: No discharge.     Conjunctiva/sclera: Conjunctivae normal.  Cardiovascular:     Rate and Rhythm: Normal rate and regular rhythm.     Heart sounds: Murmur heard.  Pulmonary:     Effort: Pulmonary effort is normal.     Breath sounds: Normal breath sounds. No wheezing or rales.  Abdominal:     General: There is no distension.     Palpations: Abdomen is soft.     Tenderness: There is no abdominal tenderness.  Neurological:     Mental Status: She is alert.  Psychiatric:        Mood and Affect: Mood normal.        Behavior: Behavior normal.     UC Treatments / Results  Labs (all labs ordered are listed, but only abnormal results are displayed) Labs Reviewed  URINALYSIS, COMPLETE (UACMP) WITH  MICROSCOPIC - Abnormal; Notable for the following components:      Result Value   Hgb urine dipstick SMALL (*)    Bacteria, UA FEW (*)    All other components within normal limits  COMPREHENSIVE METABOLIC PANEL - Abnormal; Notable for the following components:   Sodium 131 (*)    Chloride 95 (*)    All other components within normal limits  URINE CULTURE  CBC WITH DIFFERENTIAL/PLATELET  TROPONIN I (HIGH SENSITIVITY)  TROPONIN I (HIGH SENSITIVITY)    EKG   Radiology No results found.  Procedures Procedures (including critical care time)  Medications Ordered in UC Medications - No data to display  Initial Impression / Assessment and Plan / UC Course  I have reviewed the triage vital signs and the nursing notes.  Pertinent labs & imaging results that were available during my care of the patient were reviewed by me and considered in my medical decision making (see chart for details).    87 year old female presents with the above complaints. Patient endorsing generalized weakness/fatigue.  She reports that she saw blood in her urine.  Her urinalysis and microscopy was unremarkable today.  Sending culture.  Has known aortic stenosis.  Denies syncope.  Per family member and also per patient, she is able to walk without difficulty and has been working outside in the garden.  Labs obtained today and revealed mild hyponatremia.  Otherwise unremarkable.  Supportive care.  Follow-up with PCP.   Final Clinical Impressions(s) / UC Diagnoses   Final diagnoses:  Fatigue, unspecified type  Hyponatremia   Discharge Instructions   None    ED Prescriptions   None    PDMP not reviewed this encounter.   Tommie Sams, [place] 11/21/20 1416

## 2020-11-21 NOTE — ED Triage Notes (Signed)
Pt presents with complaints of generalized weakness x 3 days. Patient noticed she had blood in her urine this morning. Denies any other symptoms.

## 2020-11-23 LAB — URINE CULTURE: Culture: 20000 — AB

## 2021-09-17 DEATH — deceased
